# Patient Record
Sex: Female | Born: 1955 | Race: White | Hispanic: No | Smoking: Never smoker
Health system: Southern US, Community
[De-identification: ages and names within clinical notes are randomized; demographics above are authoritative.]

## PROBLEM LIST (undated history)

## (undated) DIAGNOSIS — E7800 Pure hypercholesterolemia, unspecified: Secondary | ICD-10-CM

## (undated) DIAGNOSIS — I2510 Atherosclerotic heart disease of native coronary artery without angina pectoris: Secondary | ICD-10-CM

## (undated) DIAGNOSIS — Z803 Family history of malignant neoplasm of breast: Secondary | ICD-10-CM

## (undated) DIAGNOSIS — L309 Dermatitis, unspecified: Secondary | ICD-10-CM

## (undated) DIAGNOSIS — J45909 Unspecified asthma, uncomplicated: Secondary | ICD-10-CM

## (undated) DIAGNOSIS — I10 Essential (primary) hypertension: Secondary | ICD-10-CM

## (undated) DIAGNOSIS — Z1501 Genetic susceptibility to malignant neoplasm of breast: Secondary | ICD-10-CM

## (undated) DIAGNOSIS — Z8041 Family history of malignant neoplasm of ovary: Secondary | ICD-10-CM

## (undated) DIAGNOSIS — J40 Bronchitis, not specified as acute or chronic: Secondary | ICD-10-CM

## (undated) DIAGNOSIS — Z1509 Genetic susceptibility to other malignant neoplasm: Principal | ICD-10-CM

## (undated) DIAGNOSIS — E119 Type 2 diabetes mellitus without complications: Secondary | ICD-10-CM

## (undated) DIAGNOSIS — J189 Pneumonia, unspecified organism: Secondary | ICD-10-CM

## (undated) HISTORY — DX: Atherosclerotic heart disease of native coronary artery without angina pectoris: I25.10

## (undated) HISTORY — DX: Family history of malignant neoplasm of breast: Z80.3

## (undated) HISTORY — PX: COLONOSCOPY: SHX174

## (undated) HISTORY — DX: Genetic susceptibility to malignant neoplasm of breast: Z15.01

## (undated) HISTORY — DX: Genetic susceptibility to other malignant neoplasm: Z15.09

## (undated) HISTORY — DX: Family history of malignant neoplasm of ovary: Z80.41

## (undated) HISTORY — PX: BREAST BIOPSY: SHX20

---

## 2010-02-27 ENCOUNTER — Encounter: Admission: RE | Admit: 2010-02-27 | Discharge: 2010-02-27 | Payer: Self-pay | Admitting: Internal Medicine

## 2010-05-27 ENCOUNTER — Encounter: Payer: Self-pay | Admitting: Internal Medicine

## 2013-05-07 HISTORY — PX: CORONARY ANGIOPLASTY: SHX604

## 2013-11-10 DIAGNOSIS — R079 Chest pain, unspecified: Secondary | ICD-10-CM | POA: Insufficient documentation

## 2013-11-10 DIAGNOSIS — R9439 Abnormal result of other cardiovascular function study: Secondary | ICD-10-CM | POA: Insufficient documentation

## 2013-11-10 DIAGNOSIS — E785 Hyperlipidemia, unspecified: Secondary | ICD-10-CM | POA: Insufficient documentation

## 2013-11-10 DIAGNOSIS — E119 Type 2 diabetes mellitus without complications: Secondary | ICD-10-CM | POA: Insufficient documentation

## 2013-11-10 DIAGNOSIS — I10 Essential (primary) hypertension: Secondary | ICD-10-CM | POA: Insufficient documentation

## 2013-11-26 DIAGNOSIS — Z955 Presence of coronary angioplasty implant and graft: Secondary | ICD-10-CM | POA: Insufficient documentation

## 2013-11-26 DIAGNOSIS — I2510 Atherosclerotic heart disease of native coronary artery without angina pectoris: Secondary | ICD-10-CM | POA: Insufficient documentation

## 2013-12-25 ENCOUNTER — Other Ambulatory Visit: Payer: Self-pay | Admitting: Internal Medicine

## 2013-12-25 DIAGNOSIS — R748 Abnormal levels of other serum enzymes: Secondary | ICD-10-CM

## 2013-12-25 DIAGNOSIS — R109 Unspecified abdominal pain: Secondary | ICD-10-CM

## 2014-01-07 ENCOUNTER — Encounter (HOSPITAL_COMMUNITY)
Admission: RE | Admit: 2014-01-07 | Discharge: 2014-01-07 | Disposition: A | Discharge status: Home / Self Care | Payer: 59 | Source: Ambulatory Visit | Attending: Cardiology | Admitting: Cardiology

## 2014-01-07 DIAGNOSIS — Z5189 Encounter for other specified aftercare: Secondary | ICD-10-CM | POA: Insufficient documentation

## 2014-01-07 DIAGNOSIS — Z9861 Coronary angioplasty status: Secondary | ICD-10-CM | POA: Insufficient documentation

## 2014-01-07 NOTE — Progress Notes (Signed)
Cardiac Rehab Medication Review by a Pharmacist  Does the patient  feel that his/her medications are working for him/her?  yes  Has the patient been experiencing any side effects to the medications prescribed?  Yes. Metformin does cause some diarrhea and metoprolol causes fatigue  Does the patient measure his/her own blood pressure or blood glucose at home?  Yes. CBGs usually around 130-150s.  Does the patient have any problems obtaining medications due to transportation or finances?   no  Understanding of regimen: good Understanding of indications: good Potential of compliance: good  Pharmacist comments: Pt taking medications that she reported to [place] as prescribed. No problems with medications except some minor side effects.  Sarah Ali 01/07/2014 8:44 AM

## 2014-01-13 ENCOUNTER — Ambulatory Visit (HOSPITAL_COMMUNITY): Payer: Self-pay

## 2014-01-13 ENCOUNTER — Encounter (HOSPITAL_COMMUNITY)
Admission: RE | Admit: 2014-01-13 | Discharge: 2014-01-13 | Disposition: A | Discharge status: Home / Self Care | Payer: 59 | Source: Ambulatory Visit | Attending: Cardiology | Admitting: Cardiology

## 2014-01-13 ENCOUNTER — Encounter (HOSPITAL_COMMUNITY): Payer: Self-pay

## 2014-01-13 DIAGNOSIS — Z5189 Encounter for other specified aftercare: Secondary | ICD-10-CM | POA: Diagnosis present

## 2014-01-13 DIAGNOSIS — Z9861 Coronary angioplasty status: Secondary | ICD-10-CM | POA: Diagnosis not present

## 2014-01-13 LAB — GLUCOSE, CAPILLARY
Glucose-Capillary: 132 mg/dL — ABNORMAL HIGH (ref 70–99)
Glucose-Capillary: 118 mg/dL — ABNORMAL HIGH (ref 70–99)

## 2014-01-13 NOTE — Progress Notes (Signed)
Pt started cardiac rehab today.  Pt tolerated light exercise without difficulty. VSS,telemetry-NSR. Asymptomatic.  PHQ-0.  Pt exhibits no barriers to rehab participation has positive outlook and good coping skills.  Pt does admit to work stressors with difficult work hours.pt displays normal grief pattern with death of her father this year.  Pt traveled frequently for 2 years prior to his death to visit. 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   Pt offered emotional support and reassurance.  Pt goals for rehab participation include increased knowledge of CAD risk factor modifications, ways to control CAD and weight loss.  Pt oriented to exercise equipment and routine.  Understanding verbalized.

## 2014-01-15 ENCOUNTER — Other Ambulatory Visit: Payer: Self-pay

## 2014-01-15 ENCOUNTER — Encounter (HOSPITAL_COMMUNITY): Payer: 59

## 2014-01-18 ENCOUNTER — Ambulatory Visit (HOSPITAL_COMMUNITY): Payer: Self-pay

## 2014-01-18 ENCOUNTER — Encounter (HOSPITAL_COMMUNITY)
Admission: RE | Admit: 2014-01-18 | Discharge: 2014-01-18 | Disposition: A | Discharge status: Home / Self Care | Payer: 59 | Source: Ambulatory Visit | Attending: Cardiology | Admitting: Cardiology

## 2014-01-18 DIAGNOSIS — Z5189 Encounter for other specified aftercare: Secondary | ICD-10-CM | POA: Diagnosis not present

## 2014-01-20 ENCOUNTER — Encounter (HOSPITAL_COMMUNITY)
Admission: RE | Admit: 2014-01-20 | Discharge: 2014-01-20 | Disposition: A | Discharge status: Home / Self Care | Payer: 59 | Source: Ambulatory Visit | Attending: Cardiology | Admitting: Cardiology

## 2014-01-20 DIAGNOSIS — Z5189 Encounter for other specified aftercare: Secondary | ICD-10-CM | POA: Diagnosis not present

## 2014-01-22 ENCOUNTER — Encounter (HOSPITAL_COMMUNITY): Payer: 59

## 2014-01-25 ENCOUNTER — Encounter (HOSPITAL_COMMUNITY)
Admission: RE | Admit: 2014-01-25 | Discharge: 2014-01-25 | Disposition: A | Discharge status: Home / Self Care | Payer: 59 | Source: Ambulatory Visit | Attending: Cardiology | Admitting: Cardiology

## 2014-01-25 DIAGNOSIS — Z5189 Encounter for other specified aftercare: Secondary | ICD-10-CM | POA: Diagnosis not present

## 2014-01-26 ENCOUNTER — Ambulatory Visit
Admission: RE | Admit: 2014-01-26 | Discharge: 2014-01-26 | Disposition: A | Discharge status: Home / Self Care | Payer: 59 | Source: Ambulatory Visit | Attending: Internal Medicine | Admitting: Internal Medicine

## 2014-01-26 ENCOUNTER — Encounter (INDEPENDENT_AMBULATORY_CARE_PROVIDER_SITE_OTHER): Payer: Self-pay

## 2014-01-26 DIAGNOSIS — R748 Abnormal levels of other serum enzymes: Secondary | ICD-10-CM

## 2014-01-26 DIAGNOSIS — R109 Unspecified abdominal pain: Secondary | ICD-10-CM

## 2014-01-27 ENCOUNTER — Encounter (HOSPITAL_COMMUNITY)
Admission: RE | Admit: 2014-01-27 | Discharge: 2014-01-27 | Disposition: A | Discharge status: Home / Self Care | Payer: 59 | Source: Ambulatory Visit | Attending: Cardiology | Admitting: Cardiology

## 2014-01-27 DIAGNOSIS — Z5189 Encounter for other specified aftercare: Secondary | ICD-10-CM | POA: Diagnosis not present

## 2014-01-29 ENCOUNTER — Encounter (HOSPITAL_COMMUNITY): Payer: 59

## 2014-02-01 ENCOUNTER — Encounter (HOSPITAL_COMMUNITY)
Admission: RE | Admit: 2014-02-01 | Discharge: 2014-02-01 | Disposition: A | Discharge status: Home / Self Care | Payer: 59 | Source: Ambulatory Visit | Attending: Cardiology | Admitting: Cardiology

## 2014-02-01 DIAGNOSIS — Z5189 Encounter for other specified aftercare: Secondary | ICD-10-CM | POA: Diagnosis not present

## 2014-02-03 ENCOUNTER — Encounter (HOSPITAL_COMMUNITY)
Admission: RE | Admit: 2014-02-03 | Discharge: 2014-02-03 | Disposition: A | Discharge status: Home / Self Care | Payer: 59 | Source: Ambulatory Visit | Attending: Cardiology | Admitting: Cardiology

## 2014-02-03 DIAGNOSIS — Z5189 Encounter for other specified aftercare: Secondary | ICD-10-CM | POA: Diagnosis not present

## 2014-02-03 NOTE — Progress Notes (Signed)
I have reviewed home exercise with Sarah Ali. The patient was advised to walk 2-4 days per week outside of CRP II for 30 minutes continuously.  Pt will also complete one additional day of hand weights outside of CRP II.  Progression of exercise prescription was discussed.  Reviewed THR, pulse, RPE, sign and symptoms, NTG use and when to call 911 or MD.  Pt voiced understanding. 0815  Archie Endo, MS, ACSM RCEP 02/03/2014 9:55 AM

## 2014-02-05 ENCOUNTER — Encounter (HOSPITAL_COMMUNITY): Payer: 59

## 2014-02-08 ENCOUNTER — Encounter (HOSPITAL_COMMUNITY)
Admission: RE | Admit: 2014-02-08 | Discharge: 2014-02-08 | Disposition: A | Discharge status: Home / Self Care | Payer: 59 | Source: Ambulatory Visit | Attending: Cardiology | Admitting: Cardiology

## 2014-02-08 DIAGNOSIS — Z955 Presence of coronary angioplasty implant and graft: Secondary | ICD-10-CM | POA: Insufficient documentation

## 2014-02-10 ENCOUNTER — Encounter (HOSPITAL_COMMUNITY)
Admission: RE | Admit: 2014-02-10 | Discharge: 2014-02-10 | Disposition: A | Discharge status: Home / Self Care | Payer: 59 | Source: Ambulatory Visit | Attending: Cardiology | Admitting: Cardiology

## 2014-02-10 DIAGNOSIS — Z955 Presence of coronary angioplasty implant and graft: Secondary | ICD-10-CM | POA: Diagnosis not present

## 2014-02-10 LAB — GLUCOSE, CAPILLARY: Glucose-Capillary: 206 mg/dL — ABNORMAL HIGH (ref 70–99)

## 2014-02-12 ENCOUNTER — Encounter (HOSPITAL_COMMUNITY): Payer: 59

## 2014-02-15 ENCOUNTER — Encounter (HOSPITAL_COMMUNITY)
Admission: RE | Admit: 2014-02-15 | Discharge: 2014-02-15 | Disposition: A | Discharge status: Home / Self Care | Payer: 59 | Source: Ambulatory Visit | Attending: Cardiology | Admitting: Cardiology

## 2014-02-15 DIAGNOSIS — Z955 Presence of coronary angioplasty implant and graft: Secondary | ICD-10-CM | POA: Diagnosis not present

## 2014-02-15 NOTE — Progress Notes (Signed)
(  late entry for 01/27/2014)  Quality of life reviewed with patient.  Pt is concerned about her overall health and wellbeing due to her recent illness.  Pt also has concerns about her work pattern, work hours are late and not conducive to family time.  She is hopeful to change her work hours in the coming months to allow her to spend more time with her teen daughter.  Pt has had stressful year with family obligations and death of her father.  However, pt displays positive outlook and good coping skills.  Pt offered emotional support and reassurance.  Pt offered appt with Jeanella Craze, hospital chaplain to discuss issues.  She would like to think about it and  she will let me know if she is interested in scheduling.

## 2014-02-17 ENCOUNTER — Encounter (HOSPITAL_COMMUNITY)
Admission: RE | Admit: 2014-02-17 | Discharge: 2014-02-17 | Disposition: A | Discharge status: Home / Self Care | Payer: 59 | Source: Ambulatory Visit | Attending: Cardiology | Admitting: Cardiology

## 2014-02-17 DIAGNOSIS — Z955 Presence of coronary angioplasty implant and graft: Secondary | ICD-10-CM | POA: Diagnosis not present

## 2014-02-17 NOTE — Progress Notes (Signed)
Sarah Ali 58 y.o. female Nutrition Note Spoke with pt.  Nutrition Plan and Nutrition Survey goals reviewed with pt. Pt is following Step 2 of the Therapeutic Lifestyle Changes diet. Pt wants to lose wt. Per pt, "I've lost 10-12 lbs since June, but I'd like to lose more." Wt loss tips briefly reviewed. Barriers to wt loss include pt's work schedule and lack of exercise on non-rehab days. Pt is diabetic. Last A1c indicates blood glucose well-controlled. Pt reports her A1c was rechecked last week and "it was 5.5." Per pt, fasting CBG's typically run 140-155 mg/dL. This Probation officer went over Diabetes Education test results. Pt expressed understanding of the information reviewed. Pt aware of nutrition education classes offered and plans on attending nutrition classes.  Nutrition Diagnosis   Food-and nutrition-related knowledge deficit related to lack of exposure to information as related to diagnosis of: ? CVD ? DM (A1c 6.7)   Overweight related to excessive energy intake as evidenced by a BMI of 29.0  Nutrition RX/ Estimated Daily Nutrition Needs for: wt loss  1200-1700 Kcal, 30-45 gm fat, 7-13 gm sat fat, 1.1-1.7 gm trans-fat, <1500 mg sodium, 150-175 gm CHO   Nutrition Intervention   Pt's individual nutrition plan reviewed with pt.   Benefits of adopting Therapeutic Lifestyle Changes discussed when Medficts reviewed.   Pt to attend the Portion Distortion class - met; 02/03/14   Pt to attend the  ? Nutrition I class                     ? Nutrition II class        ? Diabetes Blitz class - met; 01/19/14       ? Diabetes Q & A class   Continue client-centered nutrition education by RD, as part of interdisciplinary care. Goal(s)   Pt to identify food quantities necessary to achieve: ? wt loss to a goal wt of 156-174 lb (71.2-79.4 kg) at graduation from cardiac rehab.    CBG concentrations in the normal range or as close to normal as is safely possible. Monitor and Evaluate progress toward nutrition goal  with team. Nutrition Risk: Change to Moderate Derek Mound, M.Ed, RD, LDN, CDE 02/17/2014 9:32 AM

## 2014-02-19 ENCOUNTER — Encounter (HOSPITAL_COMMUNITY): Payer: 59

## 2014-02-22 ENCOUNTER — Encounter (HOSPITAL_COMMUNITY)
Admission: RE | Admit: 2014-02-22 | Discharge: 2014-02-22 | Disposition: A | Discharge status: Home / Self Care | Payer: 59 | Source: Ambulatory Visit | Attending: Cardiology | Admitting: Cardiology

## 2014-02-22 DIAGNOSIS — Z955 Presence of coronary angioplasty implant and graft: Secondary | ICD-10-CM | POA: Diagnosis not present

## 2014-02-24 ENCOUNTER — Encounter (HOSPITAL_COMMUNITY)
Admission: RE | Admit: 2014-02-24 | Discharge: 2014-02-24 | Disposition: A | Discharge status: Home / Self Care | Payer: 59 | Source: Ambulatory Visit | Attending: Cardiology | Admitting: Cardiology

## 2014-02-24 DIAGNOSIS — Z955 Presence of coronary angioplasty implant and graft: Secondary | ICD-10-CM | POA: Diagnosis not present

## 2014-02-26 ENCOUNTER — Encounter (HOSPITAL_COMMUNITY): Payer: 59

## 2014-03-01 ENCOUNTER — Encounter (HOSPITAL_COMMUNITY)
Admission: RE | Admit: 2014-03-01 | Discharge: 2014-03-01 | Disposition: A | Discharge status: Home / Self Care | Payer: 59 | Source: Ambulatory Visit | Attending: Cardiology | Admitting: Cardiology

## 2014-03-01 DIAGNOSIS — Z955 Presence of coronary angioplasty implant and graft: Secondary | ICD-10-CM | POA: Diagnosis not present

## 2014-03-03 ENCOUNTER — Encounter (HOSPITAL_COMMUNITY)
Admission: RE | Admit: 2014-03-03 | Discharge: 2014-03-03 | Disposition: A | Discharge status: Home / Self Care | Payer: 59 | Source: Ambulatory Visit | Attending: Cardiology | Admitting: Cardiology

## 2014-03-03 DIAGNOSIS — Z955 Presence of coronary angioplasty implant and graft: Secondary | ICD-10-CM | POA: Diagnosis not present

## 2014-03-05 ENCOUNTER — Encounter (HOSPITAL_COMMUNITY): Payer: 59

## 2014-03-08 ENCOUNTER — Encounter (HOSPITAL_COMMUNITY)
Admission: RE | Admit: 2014-03-08 | Discharge: 2014-03-08 | Disposition: A | Discharge status: Home / Self Care | Payer: 59 | Source: Ambulatory Visit | Attending: Cardiology | Admitting: Cardiology

## 2014-03-08 DIAGNOSIS — Z955 Presence of coronary angioplasty implant and graft: Secondary | ICD-10-CM | POA: Insufficient documentation

## 2014-03-10 ENCOUNTER — Encounter (HOSPITAL_COMMUNITY): Payer: 59

## 2014-03-10 DIAGNOSIS — Z955 Presence of coronary angioplasty implant and graft: Secondary | ICD-10-CM | POA: Diagnosis not present

## 2014-03-12 ENCOUNTER — Encounter (HOSPITAL_COMMUNITY): Payer: 59

## 2014-03-15 ENCOUNTER — Encounter (HOSPITAL_COMMUNITY)
Admission: RE | Admit: 2014-03-15 | Discharge: 2014-03-15 | Disposition: A | Discharge status: Home / Self Care | Payer: 59 | Source: Ambulatory Visit | Attending: Cardiology | Admitting: Cardiology

## 2014-03-15 DIAGNOSIS — Z955 Presence of coronary angioplasty implant and graft: Secondary | ICD-10-CM | POA: Diagnosis not present

## 2014-03-17 ENCOUNTER — Encounter (HOSPITAL_COMMUNITY)
Admission: RE | Admit: 2014-03-17 | Discharge: 2014-03-17 | Disposition: A | Discharge status: Home / Self Care | Payer: 59 | Source: Ambulatory Visit | Attending: Cardiology | Admitting: Cardiology

## 2014-03-17 DIAGNOSIS — Z955 Presence of coronary angioplasty implant and graft: Secondary | ICD-10-CM | POA: Diagnosis not present

## 2014-03-19 ENCOUNTER — Encounter (HOSPITAL_COMMUNITY): Payer: 59

## 2014-03-22 ENCOUNTER — Telehealth (HOSPITAL_COMMUNITY): Payer: Self-pay | Admitting: Internal Medicine

## 2014-03-22 ENCOUNTER — Encounter (HOSPITAL_COMMUNITY): Payer: 59

## 2014-03-24 ENCOUNTER — Encounter (HOSPITAL_COMMUNITY)
Admission: RE | Admit: 2014-03-24 | Discharge: 2014-03-24 | Disposition: A | Discharge status: Home / Self Care | Payer: 59 | Source: Ambulatory Visit | Attending: Cardiology | Admitting: Cardiology

## 2014-03-24 DIAGNOSIS — Z955 Presence of coronary angioplasty implant and graft: Secondary | ICD-10-CM | POA: Diagnosis not present

## 2014-03-26 ENCOUNTER — Encounter (HOSPITAL_COMMUNITY): Payer: 59

## 2014-03-29 ENCOUNTER — Encounter (HOSPITAL_COMMUNITY)
Admission: RE | Admit: 2014-03-29 | Discharge: 2014-03-29 | Disposition: A | Discharge status: Home / Self Care | Payer: 59 | Source: Ambulatory Visit | Attending: Cardiology | Admitting: Cardiology

## 2014-03-29 DIAGNOSIS — Z955 Presence of coronary angioplasty implant and graft: Secondary | ICD-10-CM | POA: Diagnosis not present

## 2014-03-31 ENCOUNTER — Encounter (HOSPITAL_COMMUNITY)
Admission: RE | Admit: 2014-03-31 | Discharge: 2014-03-31 | Disposition: A | Discharge status: Home / Self Care | Payer: 59 | Source: Ambulatory Visit | Attending: Cardiology | Admitting: Cardiology

## 2014-03-31 DIAGNOSIS — Z955 Presence of coronary angioplasty implant and graft: Secondary | ICD-10-CM | POA: Diagnosis not present

## 2014-04-05 ENCOUNTER — Encounter (HOSPITAL_COMMUNITY)
Admission: RE | Admit: 2014-04-05 | Discharge: 2014-04-05 | Disposition: A | Discharge status: Home / Self Care | Payer: 59 | Source: Ambulatory Visit | Attending: Cardiology | Admitting: Cardiology

## 2014-04-05 DIAGNOSIS — Z955 Presence of coronary angioplasty implant and graft: Secondary | ICD-10-CM | POA: Diagnosis not present

## 2014-04-07 ENCOUNTER — Encounter (HOSPITAL_COMMUNITY)
Admission: RE | Admit: 2014-04-07 | Discharge: 2014-04-07 | Disposition: A | Discharge status: Home / Self Care | Payer: 59 | Source: Ambulatory Visit | Attending: Cardiology | Admitting: Cardiology

## 2014-04-07 DIAGNOSIS — Z955 Presence of coronary angioplasty implant and graft: Secondary | ICD-10-CM | POA: Insufficient documentation

## 2014-04-09 ENCOUNTER — Encounter (HOSPITAL_COMMUNITY): Payer: 59

## 2014-04-12 ENCOUNTER — Encounter (HOSPITAL_COMMUNITY)
Admission: RE | Admit: 2014-04-12 | Discharge: 2014-04-12 | Disposition: A | Discharge status: Home / Self Care | Payer: 59 | Source: Ambulatory Visit | Attending: Cardiology | Admitting: Cardiology

## 2014-04-12 DIAGNOSIS — Z955 Presence of coronary angioplasty implant and graft: Secondary | ICD-10-CM | POA: Diagnosis not present

## 2014-04-14 ENCOUNTER — Encounter (HOSPITAL_COMMUNITY)
Admission: RE | Admit: 2014-04-14 | Discharge: 2014-04-14 | Disposition: A | Discharge status: Home / Self Care | Payer: 59 | Source: Ambulatory Visit | Attending: Cardiology | Admitting: Cardiology

## 2014-04-14 DIAGNOSIS — Z955 Presence of coronary angioplasty implant and graft: Secondary | ICD-10-CM | POA: Diagnosis not present

## 2014-04-16 ENCOUNTER — Encounter (HOSPITAL_COMMUNITY): Payer: 59

## 2014-04-19 ENCOUNTER — Encounter (HOSPITAL_COMMUNITY)
Admission: RE | Admit: 2014-04-19 | Discharge: 2014-04-19 | Disposition: A | Discharge status: Home / Self Care | Payer: 59 | Source: Ambulatory Visit | Attending: Cardiology | Admitting: Cardiology

## 2014-04-19 DIAGNOSIS — Z955 Presence of coronary angioplasty implant and graft: Secondary | ICD-10-CM | POA: Diagnosis not present

## 2014-04-21 ENCOUNTER — Encounter (HOSPITAL_COMMUNITY)
Admission: RE | Admit: 2014-04-21 | Discharge: 2014-04-21 | Disposition: A | Discharge status: Home / Self Care | Payer: 59 | Source: Ambulatory Visit | Attending: Cardiology | Admitting: Cardiology

## 2014-04-21 DIAGNOSIS — Z955 Presence of coronary angioplasty implant and graft: Secondary | ICD-10-CM | POA: Diagnosis not present

## 2014-04-23 ENCOUNTER — Encounter (HOSPITAL_COMMUNITY): Payer: 59

## 2014-04-26 ENCOUNTER — Encounter (HOSPITAL_COMMUNITY)
Admission: RE | Admit: 2014-04-26 | Discharge: 2014-04-26 | Disposition: A | Discharge status: Home / Self Care | Payer: 59 | Source: Ambulatory Visit | Attending: Cardiology | Admitting: Cardiology

## 2014-04-26 ENCOUNTER — Encounter (HOSPITAL_COMMUNITY): Payer: Self-pay

## 2014-04-26 DIAGNOSIS — Z955 Presence of coronary angioplasty implant and graft: Secondary | ICD-10-CM | POA: Diagnosis not present

## 2014-04-28 ENCOUNTER — Encounter (HOSPITAL_COMMUNITY)
Admission: RE | Admit: 2014-04-28 | Discharge: 2014-04-28 | Disposition: A | Discharge status: Home / Self Care | Payer: 59 | Source: Ambulatory Visit | Attending: Cardiology | Admitting: Cardiology

## 2014-04-28 DIAGNOSIS — Z955 Presence of coronary angioplasty implant and graft: Secondary | ICD-10-CM | POA: Diagnosis not present

## 2014-04-28 NOTE — Progress Notes (Signed)
Pt graduated from cardiac rehab program today with completion of  exercise sessions in Phase II. Pt maintained good attendance and progressed nicely during her participation in rehab as evidenced by increased MET level.    Medication list reconciled. Repeat  PHQ score-0.  Pt QOL scores increased with pt verbalizing increased stress and time management techniques at home and work.   Pt has made significant lifestyle changes and should be commended for her success. Pt feels she has achieved his goals during cardiac rehab especially with increased energy and discipline.  Pt has a fitbit which offers motivation and reminders to exercise.   Pt plans to continue exercising on her own using home treadmill.

## 2014-05-03 ENCOUNTER — Encounter (HOSPITAL_COMMUNITY)
Admission: RE | Admit: 2014-05-03 | Discharge: 2014-05-03 | Disposition: A | Discharge status: Home / Self Care | Payer: 59 | Source: Ambulatory Visit | Attending: Cardiology | Admitting: Cardiology

## 2014-05-03 DIAGNOSIS — Z955 Presence of coronary angioplasty implant and graft: Secondary | ICD-10-CM | POA: Diagnosis not present

## 2014-05-05 ENCOUNTER — Encounter (HOSPITAL_COMMUNITY)
Admission: RE | Admit: 2014-05-05 | Discharge: 2014-05-05 | Disposition: A | Discharge status: Home / Self Care | Payer: 59 | Source: Ambulatory Visit | Attending: Cardiology | Admitting: Cardiology

## 2014-05-05 DIAGNOSIS — Z955 Presence of coronary angioplasty implant and graft: Secondary | ICD-10-CM | POA: Diagnosis not present

## 2014-05-10 ENCOUNTER — Encounter (HOSPITAL_COMMUNITY): Admission: RE | Admit: 2014-05-10 | Payer: 59 | Source: Ambulatory Visit

## 2014-05-12 ENCOUNTER — Encounter (HOSPITAL_COMMUNITY): Payer: 59

## 2014-05-14 ENCOUNTER — Encounter (HOSPITAL_COMMUNITY): Payer: 59

## 2016-04-20 ENCOUNTER — Telehealth: Payer: Self-pay | Admitting: Genetic Counselor

## 2016-04-20 NOTE — Telephone Encounter (Signed)
I left a message with the Genetic Counseling code of [id number] and two testing codes [phone] and [phone].  I let her know hat we will put her on a wait list and call if we have a last minute cancellation.

## 2016-04-26 ENCOUNTER — Other Ambulatory Visit: Payer: Self-pay | Admitting: Obstetrics and Gynecology

## 2016-04-26 DIAGNOSIS — Z1501 Genetic susceptibility to malignant neoplasm of breast: Secondary | ICD-10-CM

## 2016-04-26 DIAGNOSIS — Z1509 Genetic susceptibility to other malignant neoplasm: Principal | ICD-10-CM

## 2016-04-27 ENCOUNTER — Ambulatory Visit
Admission: RE | Admit: 2016-04-27 | Discharge: 2016-04-27 | Disposition: A | Discharge status: Home / Self Care | Payer: 59 | Source: Ambulatory Visit | Attending: Obstetrics and Gynecology | Admitting: Obstetrics and Gynecology

## 2016-04-27 DIAGNOSIS — Z1509 Genetic susceptibility to other malignant neoplasm: Principal | ICD-10-CM

## 2016-04-27 DIAGNOSIS — Z1501 Genetic susceptibility to malignant neoplasm of breast: Secondary | ICD-10-CM

## 2016-04-27 MED ORDER — GADOBENATE DIMEGLUMINE 529 MG/ML IV SOLN
17.0000 mL | Freq: Once | INTRAVENOUS | Status: AC | PRN
  Administered 2016-04-27: 17 mL via INTRAVENOUS

## 2016-05-02 ENCOUNTER — Encounter: Payer: Self-pay | Admitting: Genetic Counselor

## 2016-05-02 ENCOUNTER — Ambulatory Visit (HOSPITAL_BASED_OUTPATIENT_CLINIC_OR_DEPARTMENT_OTHER): Payer: 59 | Admitting: Genetic Counselor

## 2016-05-02 DIAGNOSIS — Z803 Family history of malignant neoplasm of breast: Secondary | ICD-10-CM

## 2016-05-02 DIAGNOSIS — Z315 Encounter for genetic counseling: Secondary | ICD-10-CM

## 2016-05-02 DIAGNOSIS — Z1502 Genetic susceptibility to malignant neoplasm of ovary: Secondary | ICD-10-CM

## 2016-05-02 DIAGNOSIS — Z1509 Genetic susceptibility to other malignant neoplasm: Principal | ICD-10-CM

## 2016-05-02 DIAGNOSIS — Z1501 Genetic susceptibility to malignant neoplasm of breast: Secondary | ICD-10-CM | POA: Diagnosis not present

## 2016-05-02 DIAGNOSIS — Z8041 Family history of malignant neoplasm of ovary: Secondary | ICD-10-CM | POA: Insufficient documentation

## 2016-05-02 NOTE — Progress Notes (Signed)
GENETIC TEST RESULTS   Patient Name: Sarah Ali Patient Age: 60 y.o. Encounter Date: 05/02/2016  Referring Provider: Servando Salina, MD    Sarah Ali was seen in the [facility] clinic on May 02, 2016 due to a family history of cancer, a known BRCA1 mutation and concern regarding a hereditary predisposition to cancer in the family.   FAMILY HISTORY:  We obtained a detailed, 4-generation family history.  Significant diagnoses are listed below: Family History  Problem Relation Age of Onset  . Colon cancer Mother 1  . Ovarian cancer Mother 57  . Skin cancer Father   . BRCA 1/2 Brother     BRCA1 negative  . Breast cancer Maternal Aunt     dx in her 18s and again at 20  . BRCA 1/2 Maternal Aunt     BRCA1 positive  . Colon cancer Paternal Aunt     dx in her 85s  . Breast cancer Maternal Grandmother   . Parkinson's disease Maternal Grandfather 12    Ms. Mcquiston has three children, two sons and a transgender daughter who is transitioning to female who are all cancer free.  She has on sister and one brother who are cancer free.  Her brother had undergone genetic testing and is BRCA negative.  The patient's father had skin cancer and died of heart problems at 67.  He had one sister who was diagnosed with colon cancer in her 47's.  His parents are both deceased and died of non cancer related issues in their 30's and 12's.    The patient's mother was diagnosed with colon cancer, which was thought to maybe have started in her ovaries, at age 70.  Her mother has a sister who has had bilateral breast cancer and was found to have a BRCA1 mutation.  The patient's grandmother had breast cancer in her 19's-70's and her grandfather was diagnosed with Parkinson's disease at 4 and died at 60.  Patient's maternal ancestors are of [place] descent, and paternal ancestors are of [place] descent. There is reported Ashkenazi Jewish ancestry. There is no known consanguinity.  GENETIC  TESTING:  Sarah Ali underwent genetic testing at her OB/GYN office due to the known family mutation in [place].  Dr. Harvie Bridge ordered the [facility] 29 gene panel.  The genetic testing reported on March 30, 2016 through the [facility] Panel offered by [facility] identified a single, heterozygous pathogenic gene mutation called BRCA1, c.5266dupC. There were no deleterious mutations in  APC, ATM, BMPR1A, BRCA1, BRCA2, BRIP1, CDH1, CDK4, CDKN2A, CHEK2, EPCAM, GREM1, MLH1, MSH2, MSH6, MUTYH, NBN, PALB2, PMS2, POLD1, POLE, PTEN, RAD51C, RAD51D, RET, SMAD4, STK11, TP53 and VHL.  Variants of uncertain significance (VUS) have been excluded from this report.  VUS are changes that cannot be classified due to lack of information on that particular change.  While professional societies recommend that VUS's not be used in the clinical management decisions, the reporting of VUS's is encouraged for patients who have a personal history of cancer, those with a strong family history of cancer, or those who have a known hereditary cancer syndrome (and causal variant) in their family.  This was discussed with Sarah Ali. She was encouraged to talk to Dr. Harvie Bridge about contacting [facility] and getting her report reissued with VUS's reported, if she was interested in getting information on any potential VUS's that may be present.   MEDICAL MANAGEMENT: Women who have a BRCA mutation have an increased risk for both breast and ovarian cancer.  As discussed with Sarah Ali, to reduce the risk for breast cancer, prophylactic bilateral mastectomy is the most effective option. However, for women who choose to keep their breasts, we recommend yearly mammograms, yearly breast MRI, twice-yearly clinical breast exams through a high-risk clinic, and monthly self-breast exams.   To reduce the risk for ovarian cancer, we recommend Sarah Ali  have a prophylactic bilateral salpingo-oophorectomy when childbearing is completed, if  planned. We discussed that screening with CA-125 blood tests and transvaginal ultrasounds can be done twice per year. However, these tests have not been shown to detect ovarian cancer at an early stage.  RISK REDUCTION: There are several things that can be offered to individuals who are carriers for BRCA mutations that will reduce the risk for getting cancer.   The use of oral contraceptives can lower the risk for ovarian cancer, and, per case control studies, does not significantly increase the risk for breast cancer in BRCA patients. Case control studies have shown that oral contraceptives can lower the risk for ovarian cancer in women with BRCA mutations. Additionally, a more recent meta-analysis, including one cohort (n=3,181) and one case control study (1,096 cases and 2,878 controls) also showed an inverse correlation between ovarian cancer and ever having used oral contraceptives (OR, 0.58; 95% CI = 0.46-0.73). Studies on oral contraceptives and breast cancer have been conflicting, with some studies suggesting that there is not an increased risk for breast cancer in BRCA mutation carriers, while others suggest that there could be a risk. That said, two meta-analysis studies have shown that there is not an increased risk for breast cancer with oral contraceptive use in BRCA1 and BRCA2 carriers.   In individuals who have a prophylactic bilateral salpingo-oophorectomy (BSO), the risk for breast cancer is reduced by up to 50%. It has been reported that short term hormone replacement therapy in women undergoing prophylactic BSO does not negate the reduction of breast cancer risk associated with surgery (1.2018 NCCN guidelines).  FAMILY MEMBERS: It is important that all of Sarah Ali's relatives (both men and women) know of the presence of this gene mutation. Site-specific genetic testing can sort out who in the family is at risk and who is not.   Sarah Ali's children and siblings have a 50%  chance to have inherited this mutation. We recommend they have genetic testing for this same mutation, as identifying the presence of this mutation would allow them to also take advantage of risk-reducing measures.   We discussed the specific issue about Ms. Withrow's daughter, who is transgender and is in the process of transitioning from female to female. As with her other children, it will be important to understand if he is BRCA1 positive or not. However, if he is BRCA1 positive, there may be specific issues surrounding his transition that may (or may not) be important to address.  Transgender is an emerging issue in the BRCA community, and most information is on the transition from female to female and exposure to estrogen in this process.  We discussed that, should he be BRCA1 positive, Ms. Copes's son will need to perform his own self breast exams, however, at some point he is interested in undergoing a mastectomy.  He will also need to consider removal of his fallopian tubes and ovaries at some point.  We discussed whether it would be important to undergo genetic testing now at age 3.  Considering that he is in active transition from female to female, and that he is 32, there  may be other issues that may be more pressing at this current time.  We would not recommend undergoing breast cancer screening until around age 17, so allowing him to complete his transition and think about testing later is one strategy to consider.  We discussed that typically we wait to test women who are NOT transitioning until closer to age 94 because of all the changes young people are going through (school, moving out on their own, starting jobs, etc...), that dealing with a BRCA1 positive result may be easier once many changes are already out of the way.  SUPPORT AND RESOURCES: If Ms. Hostler is interested in BRCA-specific information and support, there are two groups, Facing Our Risk (www.facingourrisk.com) and Bright Pink  (www.brightpink.org) which some people have found useful. Based on her Ashkenazi [place] heritage, we also provided information about Engineer, maintenance (IT) (www.sharsheret.com).  They provide opportunities to speak with other individuals from high-risk families. To locate genetic counselors in other cities, visit the website of the [employer] (ArtistMovie.se) and Secretary/administrator for a Social worker by zip code.  Lastly, I provided information about the [place] out of [facility].  This group will link up families with researchers, if interested, as well as provide a twice yearly newsletter on updates about genetic syndromes.  We encouraged Ms. Harkey to remain in contact with [place] on an annual basis so we can update her personal and family histories, and let her know of advances in cancer genetics that may benefit the family. Our contact number was provided. Ms. Koke's questions were answered to her satisfaction today, and she knows she is welcome to call anytime with additional questions.   Sobia Karger P. Florene Glen, [place], [facility] Certified Genetic Counselor [email or web address] phone: [phone]

## 2016-05-30 ENCOUNTER — Other Ambulatory Visit: Payer: Self-pay | Admitting: Obstetrics and Gynecology

## 2016-05-30 DIAGNOSIS — N63 Unspecified lump in unspecified breast: Secondary | ICD-10-CM

## 2016-06-04 ENCOUNTER — Other Ambulatory Visit: Payer: 59

## 2016-06-05 ENCOUNTER — Ambulatory Visit
Admission: RE | Admit: 2016-06-05 | Discharge: 2016-06-05 | Disposition: A | Discharge status: Home / Self Care | Payer: 59 | Source: Ambulatory Visit | Attending: Obstetrics and Gynecology | Admitting: Obstetrics and Gynecology

## 2016-06-05 DIAGNOSIS — N6323 Unspecified lump in the left breast, lower outer quadrant: Secondary | ICD-10-CM | POA: Diagnosis not present

## 2016-06-05 DIAGNOSIS — N63 Unspecified lump in unspecified breast: Secondary | ICD-10-CM

## 2016-06-05 DIAGNOSIS — N6324 Unspecified lump in the left breast, lower inner quadrant: Secondary | ICD-10-CM | POA: Diagnosis not present

## 2016-06-14 ENCOUNTER — Other Ambulatory Visit: Payer: Self-pay | Admitting: Obstetrics and Gynecology

## 2016-06-14 DIAGNOSIS — R928 Other abnormal and inconclusive findings on diagnostic imaging of breast: Secondary | ICD-10-CM

## 2016-06-29 ENCOUNTER — Ambulatory Visit
Admission: RE | Admit: 2016-06-29 | Discharge: 2016-06-29 | Disposition: A | Discharge status: Home / Self Care | Payer: 59 | Source: Ambulatory Visit | Attending: Obstetrics and Gynecology | Admitting: Obstetrics and Gynecology

## 2016-06-29 DIAGNOSIS — N6323 Unspecified lump in the left breast, lower outer quadrant: Secondary | ICD-10-CM | POA: Diagnosis not present

## 2016-06-29 DIAGNOSIS — R928 Other abnormal and inconclusive findings on diagnostic imaging of breast: Secondary | ICD-10-CM

## 2016-06-29 DIAGNOSIS — N6012 Diffuse cystic mastopathy of left breast: Secondary | ICD-10-CM | POA: Diagnosis not present

## 2016-06-29 MED ORDER — GADOBENATE DIMEGLUMINE 529 MG/ML IV SOLN
17.0000 mL | Freq: Once | INTRAVENOUS | Status: AC | PRN
  Administered 2016-06-29: 17 mL via INTRAVENOUS

## 2016-07-04 ENCOUNTER — Other Ambulatory Visit: Payer: Self-pay | Admitting: Obstetrics and Gynecology

## 2016-07-05 HISTORY — PX: BREAST SURGERY: SHX581

## 2016-07-06 NOTE — Patient Instructions (Signed)
Your procedure is scheduled on:  Thursday, July 19, 2016  Enter through the Main Entrance of [facility] at:  6:00 AM  Pick up the phone at the desk and dial [phone].  Call this number if you have problems the morning of surgery: [phone].  Remember: Do NOT eat food or drink after:  Midnight Wednesday, July 18, 2016  Take these medicines the morning of surgery with a SIP OF WATER:  Amlodipine  Do NOT take evening dose of Metformin the day before surgery  Stop ALL herbal medications and Vitamin E at this time  Do NOT smoke the day of surgery.  Do NOT wear jewelry (body piercing), metal hair clips/bobby pins, make-up, or nail polish. Do NOT wear lotions, powders, or perfumes.  You may wear deodorant. Do NOT shave for 48 hours prior to surgery. Do NOT bring valuables to the hospital. Contacts, dentures, or bridgework may not be worn into surgery.  Have a responsible adult drive you home and stay with you for 24 hours after your procedure  Bring a copy of your healthcare power of attorney and living will documents.  **Effective Friday, Jan. 12, 2018, [place] will implement no hospital visitations from children age 57 and younger due to a steady increase in flu activity in our community and hospitals. **

## 2016-07-09 ENCOUNTER — Inpatient Hospital Stay (HOSPITAL_COMMUNITY)
Admission: RE | Admit: 2016-07-09 | Discharge: 2016-07-09 | Disposition: A | Discharge status: Home / Self Care | Payer: 59 | Source: Ambulatory Visit

## 2016-07-10 NOTE — Patient Instructions (Addendum)
Your procedure is scheduled on:  Thursday, July 19, 2016  Enter through the Main Entrance of [facility] at:  6:00 AM  Pick up the phone at the desk and dial [phone].  Call this number if you have problems the morning of surgery: [phone].  Remember: Do NOT eat food or drink after:  Midnight Wednesday  Take these medicines the morning of surgery with a SIP OF WATER:  Amlodipine  Do not take evening dose of Metformin  Stop ALL herbal medications and Vitamin E at this time  Do NOT smoke the day of surgery.  Do NOT wear jewelry (body piercing), metal hair clips/bobby pins, make-up, or nail polish. Do NOT wear lotions, powders, or perfumes.  You may wear deodorant. Do NOT shave for 48 hours prior to surgery. Do NOT bring valuables to the hospital. Contacts, dentures, or bridgework may not be worn into surgery.  Have a responsible adult drive you home and stay with you for 24 hours after your procedure  Bring a copy of your healthcare power of attorney and living will documents.  **Effective Friday, Jan. 12, 2018, [place] will implement no hospital visitations from children age 56 and younger due to a steady increase in flu activity in our community and hospitals. **

## 2016-07-11 ENCOUNTER — Other Ambulatory Visit: Payer: Self-pay

## 2016-07-11 ENCOUNTER — Encounter (HOSPITAL_COMMUNITY): Payer: Self-pay

## 2016-07-11 ENCOUNTER — Encounter (HOSPITAL_COMMUNITY)
Admission: RE | Admit: 2016-07-11 | Discharge: 2016-07-11 | Disposition: A | Discharge status: Home / Self Care | Payer: 59 | Source: Ambulatory Visit | Attending: Obstetrics and Gynecology | Admitting: Obstetrics and Gynecology

## 2016-07-11 DIAGNOSIS — E7800 Pure hypercholesterolemia, unspecified: Secondary | ICD-10-CM | POA: Diagnosis not present

## 2016-07-11 DIAGNOSIS — I510 Cardiac septal defect, acquired: Secondary | ICD-10-CM | POA: Diagnosis not present

## 2016-07-11 DIAGNOSIS — Z01812 Encounter for preprocedural laboratory examination: Secondary | ICD-10-CM | POA: Insufficient documentation

## 2016-07-11 DIAGNOSIS — Z803 Family history of malignant neoplasm of breast: Secondary | ICD-10-CM | POA: Diagnosis not present

## 2016-07-11 DIAGNOSIS — Z8041 Family history of malignant neoplasm of ovary: Secondary | ICD-10-CM | POA: Insufficient documentation

## 2016-07-11 DIAGNOSIS — I10 Essential (primary) hypertension: Secondary | ICD-10-CM | POA: Diagnosis not present

## 2016-07-11 DIAGNOSIS — Z1502 Genetic susceptibility to malignant neoplasm of ovary: Secondary | ICD-10-CM | POA: Diagnosis not present

## 2016-07-11 DIAGNOSIS — E119 Type 2 diabetes mellitus without complications: Secondary | ICD-10-CM | POA: Diagnosis not present

## 2016-07-11 DIAGNOSIS — Z01810 Encounter for preprocedural cardiovascular examination: Secondary | ICD-10-CM | POA: Insufficient documentation

## 2016-07-11 HISTORY — DX: Bronchitis, not specified as acute or chronic: J40

## 2016-07-11 HISTORY — DX: Type 2 diabetes mellitus without complications: E11.9

## 2016-07-11 HISTORY — DX: Essential (primary) hypertension: I10

## 2016-07-11 HISTORY — DX: Unspecified asthma, uncomplicated: J45.909

## 2016-07-11 HISTORY — DX: Dermatitis, unspecified: L30.9

## 2016-07-11 HISTORY — DX: Pneumonia, unspecified organism: J18.9

## 2016-07-11 HISTORY — DX: Pure hypercholesterolemia, unspecified: E78.00

## 2016-07-11 LAB — CBC
HCT: 42.1 % (ref 36.0–46.0)
HEMOGLOBIN: 14.9 g/dL (ref 12.0–15.0)
MCH: 30.8 pg (ref 26.0–34.0)
MCHC: 35.4 g/dL (ref 30.0–36.0)
MCV: 87 fL (ref 78.0–100.0)
PLATELETS: 175 10*3/uL (ref 150–400)
RBC: 4.84 MIL/uL (ref 3.87–5.11)
RDW: 13.2 % (ref 11.5–15.5)
WBC: 5 10*3/uL (ref 4.0–10.5)

## 2016-07-11 LAB — BASIC METABOLIC PANEL
ANION GAP: 11 (ref 5–15)
BUN: 20 mg/dL (ref 6–20)
CHLORIDE: 102 mmol/L (ref 101–111)
CO2: 26 mmol/L (ref 22–32)
Calcium: 9.3 mg/dL (ref 8.9–10.3)
Creatinine, Ser: 0.92 mg/dL (ref 0.44–1.00)
GFR calc Af Amer: >60 mL/min (ref >60)
Glucose, Bld: 209 mg/dL — ABNORMAL HIGH (ref 65–99)
POTASSIUM: 4.1 mmol/L (ref 3.5–5.1)
Sodium: 139 mmol/L (ref 135–145)

## 2016-07-12 NOTE — Pre-Procedure Instructions (Addendum)
Dr. Lyndle Herrlich reviewed EKG's and Ms Deis's history, made aware of glucose 209 (not fasting)  no new orders received at this time.

## 2016-07-19 ENCOUNTER — Ambulatory Visit (HOSPITAL_COMMUNITY)
Admission: RE | Admit: 2016-07-19 | Discharge: 2016-07-19 | Disposition: A | Discharge status: Home / Self Care | Payer: 59 | Source: Ambulatory Visit | Attending: Obstetrics and Gynecology | Admitting: Obstetrics and Gynecology

## 2016-07-19 ENCOUNTER — Ambulatory Visit (HOSPITAL_COMMUNITY): Payer: 59 | Admitting: Anesthesiology

## 2016-07-19 ENCOUNTER — Encounter (HOSPITAL_COMMUNITY)
Admission: RE | Disposition: A | Discharge status: Home / Self Care | Payer: Self-pay | Source: Ambulatory Visit | Attending: Obstetrics and Gynecology

## 2016-07-19 ENCOUNTER — Encounter (HOSPITAL_COMMUNITY): Payer: Self-pay | Admitting: *Deleted

## 2016-07-19 DIAGNOSIS — Z955 Presence of coronary angioplasty implant and graft: Secondary | ICD-10-CM | POA: Insufficient documentation

## 2016-07-19 DIAGNOSIS — Z8249 Family history of ischemic heart disease and other diseases of the circulatory system: Secondary | ICD-10-CM | POA: Insufficient documentation

## 2016-07-19 DIAGNOSIS — Z7984 Long term (current) use of oral hypoglycemic drugs: Secondary | ICD-10-CM | POA: Diagnosis not present

## 2016-07-19 DIAGNOSIS — Z1501 Genetic susceptibility to malignant neoplasm of breast: Secondary | ICD-10-CM | POA: Diagnosis not present

## 2016-07-19 DIAGNOSIS — Z833 Family history of diabetes mellitus: Secondary | ICD-10-CM | POA: Insufficient documentation

## 2016-07-19 DIAGNOSIS — E119 Type 2 diabetes mellitus without complications: Secondary | ICD-10-CM | POA: Diagnosis not present

## 2016-07-19 DIAGNOSIS — I10 Essential (primary) hypertension: Secondary | ICD-10-CM | POA: Diagnosis not present

## 2016-07-19 DIAGNOSIS — Z8041 Family history of malignant neoplasm of ovary: Secondary | ICD-10-CM | POA: Diagnosis not present

## 2016-07-19 DIAGNOSIS — N840 Polyp of corpus uteri: Secondary | ICD-10-CM | POA: Insufficient documentation

## 2016-07-19 DIAGNOSIS — K660 Peritoneal adhesions (postprocedural) (postinfection): Secondary | ICD-10-CM | POA: Insufficient documentation

## 2016-07-19 DIAGNOSIS — Z803 Family history of malignant neoplasm of breast: Secondary | ICD-10-CM | POA: Diagnosis not present

## 2016-07-19 DIAGNOSIS — I2510 Atherosclerotic heart disease of native coronary artery without angina pectoris: Secondary | ICD-10-CM | POA: Insufficient documentation

## 2016-07-19 DIAGNOSIS — N83202 Unspecified ovarian cyst, left side: Secondary | ICD-10-CM | POA: Diagnosis not present

## 2016-07-19 DIAGNOSIS — Z79899 Other long term (current) drug therapy: Secondary | ICD-10-CM | POA: Insufficient documentation

## 2016-07-19 HISTORY — PX: ROBOTIC ASSISTED BILATERAL SALPINGO OOPHERECTOMY: SHX6078

## 2016-07-19 HISTORY — PX: DILATATION & CURRETTAGE/HYSTEROSCOPY WITH RESECTOCOPE: SHX5572

## 2016-07-19 LAB — GLUCOSE, CAPILLARY
Glucose-Capillary: 156 mg/dL — ABNORMAL HIGH (ref 65–99)
Glucose-Capillary: 153 mg/dL — ABNORMAL HIGH (ref 65–99)

## 2016-07-19 SURGERY — DILATATION & CURETTAGE/HYSTEROSCOPY WITH RESECTOCOPE
Anesthesia: General | Site: Vagina

## 2016-07-19 MED ORDER — LIDOCAINE HCL (CARDIAC) 20 MG/ML IV SOLN
INTRAVENOUS | Status: AC
  Filled 2016-07-19: qty 5

## 2016-07-19 MED ORDER — SCOPOLAMINE 1 MG/3DAYS TD PT72
MEDICATED_PATCH | TRANSDERMAL | Status: AC
  Administered 2016-07-19: 1.5 mg via TRANSDERMAL
  Filled 2016-07-19: qty 1

## 2016-07-19 MED ORDER — FENTANYL CITRATE (PF) 250 MCG/5ML IJ SOLN
INTRAMUSCULAR | Status: DC | PRN
  Administered 2016-07-19 (×2): 100 ug via INTRAVENOUS
  Administered 2016-07-19: 50 ug via INTRAVENOUS
  Administered 2016-07-19: 150 ug via INTRAVENOUS
  Administered 2016-07-19: 100 ug via INTRAVENOUS

## 2016-07-19 MED ORDER — LIDOCAINE HCL (CARDIAC) 20 MG/ML IV SOLN
INTRAVENOUS | Status: DC | PRN
  Administered 2016-07-19: 60 mg via INTRAVENOUS

## 2016-07-19 MED ORDER — DEXAMETHASONE SODIUM PHOSPHATE 10 MG/ML IJ SOLN
INTRAMUSCULAR | Status: DC | PRN
  Administered 2016-07-19: 10 mg via INTRAVENOUS

## 2016-07-19 MED ORDER — SODIUM CHLORIDE 0.9 % IR SOLN
Status: DC | PRN
  Administered 2016-07-19: 3000 mL

## 2016-07-19 MED ORDER — CHLOROPROCAINE HCL 1 % IJ SOLN
INTRAMUSCULAR | Status: AC
  Filled 2016-07-19: qty 30

## 2016-07-19 MED ORDER — ONDANSETRON HCL 4 MG/2ML IJ SOLN
INTRAMUSCULAR | Status: DC | PRN
  Administered 2016-07-19: 4 mg via INTRAVENOUS

## 2016-07-19 MED ORDER — BUPIVACAINE HCL (PF) 0.25 % IJ SOLN
INTRAMUSCULAR | Status: AC
  Filled 2016-07-19: qty 30

## 2016-07-19 MED ORDER — MIDAZOLAM HCL 2 MG/2ML IJ SOLN
INTRAMUSCULAR | Status: AC
  Filled 2016-07-19: qty 2

## 2016-07-19 MED ORDER — SODIUM CHLORIDE 0.9 % IJ SOLN
INTRAMUSCULAR | Status: AC
  Filled 2016-07-19: qty 50

## 2016-07-19 MED ORDER — KETOROLAC TROMETHAMINE 30 MG/ML IJ SOLN
INTRAMUSCULAR | Status: AC
  Filled 2016-07-19: qty 1

## 2016-07-19 MED ORDER — OXYCODONE HCL 5 MG/5ML PO SOLN
5.0000 mg | Freq: Once | ORAL | Status: AC | PRN
Start: 2016-07-19 — End: 2016-07-19

## 2016-07-19 MED ORDER — ONDANSETRON HCL 4 MG/2ML IJ SOLN
INTRAMUSCULAR | Status: AC
  Filled 2016-07-19: qty 2

## 2016-07-19 MED ORDER — KETOROLAC TROMETHAMINE 30 MG/ML IJ SOLN
INTRAMUSCULAR | Status: DC | PRN
  Administered 2016-07-19: 30 mg via INTRAVENOUS

## 2016-07-19 MED ORDER — OXYCODONE HCL 5 MG PO TABS
ORAL_TABLET | ORAL | Status: AC
  Administered 2016-07-19: 5 mg via ORAL
  Filled 2016-07-19: qty 1

## 2016-07-19 MED ORDER — GLYCOPYRROLATE 0.2 MG/ML IJ SOLN
INTRAMUSCULAR | Status: AC
  Filled 2016-07-19: qty 4

## 2016-07-19 MED ORDER — OXYCODONE HCL 5 MG PO TABS
5.0000 mg | ORAL_TABLET | Freq: Once | ORAL | Status: AC | PRN
  Administered 2016-07-19: 5 mg via ORAL

## 2016-07-19 MED ORDER — FENTANYL CITRATE (PF) 100 MCG/2ML IJ SOLN
INTRAMUSCULAR | Status: AC
  Administered 2016-07-19: 25 ug via INTRAVENOUS
  Filled 2016-07-19: qty 2

## 2016-07-19 MED ORDER — SCOPOLAMINE 1 MG/3DAYS TD PT72
1.0000 | MEDICATED_PATCH | Freq: Once | TRANSDERMAL | Status: DC
  Administered 2016-07-19: 1.5 mg via TRANSDERMAL

## 2016-07-19 MED ORDER — SUGAMMADEX SODIUM 200 MG/2ML IV SOLN
INTRAVENOUS | Status: AC
Start: 2016-07-19 — End: ?
  Filled 2016-07-19: qty 2

## 2016-07-19 MED ORDER — LACTATED RINGERS IR SOLN
Status: DC | PRN
  Administered 2016-07-19: 3000 mL

## 2016-07-19 MED ORDER — ROCURONIUM BROMIDE 100 MG/10ML IV SOLN
INTRAVENOUS | Status: DC | PRN
  Administered 2016-07-19: 10 mg via INTRAVENOUS
  Administered 2016-07-19: 20 mg via INTRAVENOUS
  Administered 2016-07-19: 50 mg via INTRAVENOUS

## 2016-07-19 MED ORDER — BUPIVACAINE HCL (PF) 0.25 % IJ SOLN
INTRAMUSCULAR | Status: DC | PRN
  Administered 2016-07-19: 5 mL

## 2016-07-19 MED ORDER — ROPIVACAINE HCL 5 MG/ML IJ SOLN
INTRAMUSCULAR | Status: AC
  Filled 2016-07-19: qty 30

## 2016-07-19 MED ORDER — SUGAMMADEX SODIUM 200 MG/2ML IV SOLN
INTRAVENOUS | Status: DC | PRN
  Administered 2016-07-19: 200 mg via INTRAVENOUS

## 2016-07-19 MED ORDER — PROPOFOL 10 MG/ML IV BOLUS
INTRAVENOUS | Status: AC
  Filled 2016-07-19: qty 20

## 2016-07-19 MED ORDER — DEXAMETHASONE SODIUM PHOSPHATE 10 MG/ML IJ SOLN
INTRAMUSCULAR | Status: AC
  Filled 2016-07-19: qty 1

## 2016-07-19 MED ORDER — MIDAZOLAM HCL 5 MG/5ML IJ SOLN
INTRAMUSCULAR | Status: DC | PRN
  Administered 2016-07-19: 2 mg via INTRAVENOUS

## 2016-07-19 MED ORDER — IBUPROFEN 800 MG PO TABS: 800.0000 mg | ORAL_TABLET | Freq: Three times a day (TID) | ORAL | 0 refills | Status: DC | PRN

## 2016-07-19 MED ORDER — ONDANSETRON HCL 4 MG/2ML IJ SOLN: 4.0000 mg | Freq: Once | INTRAMUSCULAR | Status: DC | PRN

## 2016-07-19 MED ORDER — FENTANYL CITRATE (PF) 100 MCG/2ML IJ SOLN
25.0000 ug | INTRAMUSCULAR | Status: DC | PRN
  Administered 2016-07-19 (×2): 25 ug via INTRAVENOUS

## 2016-07-19 MED ORDER — FENTANYL CITRATE (PF) 250 MCG/5ML IJ SOLN
INTRAMUSCULAR | Status: AC
  Filled 2016-07-19: qty 5

## 2016-07-19 MED ORDER — LACTATED RINGERS IV SOLN
INTRAVENOUS | Status: DC
  Administered 2016-07-19 (×2): via INTRAVENOUS

## 2016-07-19 MED ORDER — NEOSTIGMINE METHYLSULFATE 10 MG/10ML IV SOLN
INTRAVENOUS | Status: AC
  Filled 2016-07-19: qty 1

## 2016-07-19 MED ORDER — PROPOFOL 10 MG/ML IV BOLUS
INTRAVENOUS | Status: DC | PRN
  Administered 2016-07-19: 130 mg via INTRAVENOUS

## 2016-07-19 SURGICAL SUPPLY — 71 items
APPLICATOR ARISTA FLEXITIP XL (MISCELLANEOUS) IMPLANT
BARRIER ADHS 3X4 INTERCEED (GAUZE/BANDAGES/DRESSINGS) IMPLANT
BIPOLAR CUTTING LOOP 21FR (ELECTRODE)
CANISTER SUCT 3000ML (MISCELLANEOUS) ×4 IMPLANT
CANNULA SEAL DVNC (CANNULA) ×4 IMPLANT
CANNULA SEALS DA VINCI (CANNULA) ×4
CATH FOLEY 3WAY  5CC 16FR (CATHETERS) ×2
CATH FOLEY 3WAY 5CC 16FR (CATHETERS) ×2 IMPLANT
CATH ROBINSON RED A/P 16FR (CATHETERS) ×4 IMPLANT
CLOTH BEACON ORANGE TIMEOUT ST (SAFETY) ×4 IMPLANT
CONT PATH 16OZ SNAP LID 3702 (MISCELLANEOUS) ×4 IMPLANT
CONTAINER PREFILL 10% NBF 60ML (FORM) ×8 IMPLANT
COVER BACK TABLE 60X90IN (DRAPES) ×8 IMPLANT
COVER LIGHT HANDLE  1/PK (MISCELLANEOUS) ×2
COVER LIGHT HANDLE 1/PK (MISCELLANEOUS) ×2 IMPLANT
COVER TIP SHEARS 8 DVNC (MISCELLANEOUS) ×2 IMPLANT
COVER TIP SHEARS 8MM DA VINCI (MISCELLANEOUS) ×2
DECANTER SPIKE VIAL GLASS SM (MISCELLANEOUS) ×8 IMPLANT
DEFOGGER SCOPE WARMER CLEARIFY (MISCELLANEOUS) ×4 IMPLANT
DERMABOND ADVANCED (GAUZE/BANDAGES/DRESSINGS) ×2
DERMABOND ADVANCED .7 DNX12 (GAUZE/BANDAGES/DRESSINGS) ×2 IMPLANT
DEVICE MYOSURE LITE (MISCELLANEOUS) ×4 IMPLANT
DRSG OPSITE POSTOP 3X4 (GAUZE/BANDAGES/DRESSINGS) ×4 IMPLANT
DRSG TELFA 3X8 NADH (GAUZE/BANDAGES/DRESSINGS) ×4 IMPLANT
DURAPREP 26ML APPLICATOR (WOUND CARE) ×4 IMPLANT
ELECT REM PT RETURN 9FT ADLT (ELECTROSURGICAL) ×4
ELECTRODE REM PT RTRN 9FT ADLT (ELECTROSURGICAL) ×2 IMPLANT
GAUZE VASELINE 3X9 (GAUZE/BANDAGES/DRESSINGS) IMPLANT
GLOVE BIOGEL PI IND STRL 7.0 (GLOVE) ×10 IMPLANT
GLOVE BIOGEL PI INDICATOR 7.0 (GLOVE) ×10
GLOVE ECLIPSE 6.5 STRL STRAW (GLOVE) ×12 IMPLANT
GOWN STRL REUS W/TWL LRG LVL3 (GOWN DISPOSABLE) ×8 IMPLANT
HEMOSTAT ARISTA ABSORB 3G PWDR (MISCELLANEOUS) IMPLANT
KIT ACCESSORY DA VINCI DISP (KITS)
KIT ACCESSORY DVNC DISP (KITS) IMPLANT
LEGGING LITHOTOMY PAIR STRL (DRAPES) ×4 IMPLANT
LOOP CUTTING BIPOLAR 21FR (ELECTRODE) IMPLANT
NEEDLE INSUFFLATION 150MM (ENDOMECHANICALS) ×4 IMPLANT
NS IRRIG 1000ML POUR BTL (IV SOLUTION) IMPLANT
PACK ROBOT WH (CUSTOM PROCEDURE TRAY) ×4 IMPLANT
PACK ROBOTIC GOWN (GOWN DISPOSABLE) ×4 IMPLANT
PACK TRENDGUARD 450 HYBRID PRO (MISCELLANEOUS) ×2 IMPLANT
PACK VAGINAL MINOR WOMEN LF (CUSTOM PROCEDURE TRAY) ×4 IMPLANT
PAD OB MATERNITY 4.3X12.25 (PERSONAL CARE ITEMS) ×4 IMPLANT
PAD PREP 24X48 CUFFED NSTRL (MISCELLANEOUS) ×4 IMPLANT
POUCH SPECIMEN RETRIEVAL 10MM (ENDOMECHANICALS) ×8 IMPLANT
PROTECTOR NERVE ULNAR (MISCELLANEOUS) ×8 IMPLANT
SEAL ROD LENS SCOPE MYOSURE (ABLATOR) ×4 IMPLANT
SET CYSTO W/LG BORE CLAMP LF (SET/KITS/TRAYS/PACK) IMPLANT
SET IRRIG TUBING LAPAROSCOPIC (IRRIGATION / IRRIGATOR) ×4 IMPLANT
SET TRI-LUMEN FLTR TB AIRSEAL (TUBING) ×4 IMPLANT
SUT VICRYL 0 UR6 27IN ABS (SUTURE) ×8 IMPLANT
SUT VICRYL 4-0 PS2 18IN ABS (SUTURE) ×8 IMPLANT
SYR 50ML LL SCALE MARK (SYRINGE) IMPLANT
SYSTEM CONVERTIBLE TROCAR (TROCAR) ×4 IMPLANT
TIP UTERINE 5.1X6CM LAV DISP (MISCELLANEOUS) IMPLANT
TIP UTERINE 6.7X10CM GRN DISP (MISCELLANEOUS) IMPLANT
TIP UTERINE 6.7X6CM WHT DISP (MISCELLANEOUS) IMPLANT
TIP UTERINE 6.7X8CM BLUE DISP (MISCELLANEOUS) ×4 IMPLANT
TOWEL OR 17X24 6PK STRL BLUE (TOWEL DISPOSABLE) ×12 IMPLANT
TRENDGUARD 450 HYBRID PRO PACK (MISCELLANEOUS) ×4
TROCAR BALLN 12MMX100 BLUNT (TROCAR) ×4 IMPLANT
TROCAR DISP BLADELESS 8 DVNC (TROCAR) ×2 IMPLANT
TROCAR DISP BLADELESS 8MM (TROCAR) ×2
TROCAR PORT AIRSEAL 5X120 (TROCAR) ×4 IMPLANT
TROCAR PORT AIRSEAL 8X120 (TROCAR) IMPLANT
TROCAR Z-THREAD 12X150 (TROCAR) ×4 IMPLANT
TUBING AQUILEX INFLOW (TUBING) ×4 IMPLANT
TUBING AQUILEX OUTFLOW (TUBING) ×4 IMPLANT
WARMER LAPAROSCOPE (MISCELLANEOUS) ×4 IMPLANT
WATER STERILE IRR 1000ML POUR (IV SOLUTION) ×4 IMPLANT

## 2016-07-19 NOTE — Anesthesia Preprocedure Evaluation (Signed)

## 2016-07-19 NOTE — Anesthesia Postprocedure Evaluation (Signed)
Anesthesia Post Note  Patient: Sarah Ali  Procedure(s) Performed: Procedure(s) (LRB): DILATATION & CURETTAGE/HYSTEROSCOPY WITH RESECTION OF ENDOMETRIAL POLYP WITH MYOSURE (N/A) ROBOTIC ASSISTED BILATERAL SALPINGO OOPHORECTOMY AND LYSIS OF ADHESIONS, PELVIC WASHINGS (Bilateral)  Anesthesia Type: General       Last Vitals:  Vitals:   07/19/16 1100 07/19/16 1115  BP: (!) 118/54 113/61  Pulse: 63 69  Resp: 13 16  Temp:      Last Pain:  Vitals:   07/19/16 1045  TempSrc:   PainSc: 5                  Harmony Sandell COKER

## 2016-07-19 NOTE — Brief Op Note (Signed)
07/19/2016  10:04 AM  PATIENT:  Sarah Ali  61 y.o. female  PRE-OPERATIVE DIAGNOSIS:  Endometrial Mass, BRCA 1   POST-OPERATIVE DIAGNOSIS:  Endometrial polyp,,BRCA 1 POSITIVE   PROCEDURE:  DaVinci robotic BSO, lysis of adhesions, dx hysteroscopy, D&C, hysteroscopic resection of endometrial polyps, pelvic washings  SURGEON:  Surgeon(s) and Role:    * Servando Salina, MD - Primary  PHYSICIAN ASSISTANT:   ASSISTANTS: Artelia Laroche, CNM ( robotic portion)  ANESTHESIA:   general Findings; left ant abdominal wall adhesions of omentum, left adnexa obscured by bowel and adhesions, nl right ureter, uterus nl, nl appendix, nl right tube and ovary, nl liver edge, left ovary nl, left tube nl, uterine cavity with large fundal endom polyp and one post LUS polyp  EBL:  Total I/O In: 1300 [I.V.:1300] Out: 295 [Urine:275; Blood:20]  BLOOD ADMINISTERED:none  DRAINS: none   LOCAL MEDICATIONS USED:  MARCAINE     SPECIMEN:  Source of Specimen:  RSO, LSO, pelvic washings, endom polyp with EMC  DISPOSITION OF SPECIMEN:  PATHOLOGY  COUNTS:  YES  TOURNIQUET:  * No tourniquets in log *  DICTATION: .Other Dictation: Dictation Number [id number]  PLAN OF CARE: Discharge to home after PACU  PATIENT DISPOSITION:  PACU - hemodynamically stable.   Delay start of Pharmacological VTE agent (>24hrs) due to surgical blood loss or risk of bleeding: not applicable

## 2016-07-19 NOTE — Anesthesia Postprocedure Evaluation (Signed)
Anesthesia Post Note  Patient: Sarah Ali  Procedure(s) Performed: Procedure(s) (LRB): DILATATION & CURETTAGE/HYSTEROSCOPY WITH RESECTION OF ENDOMETRIAL POLYP WITH MYOSURE (N/A) ROBOTIC ASSISTED BILATERAL SALPINGO OOPHORECTOMY AND LYSIS OF ADHESIONS, PELVIC WASHINGS (Bilateral)  Patient location during evaluation: PACU Anesthesia Type: General Level of consciousness: awake, awake and alert and oriented Pain management: pain level controlled Vital Signs Assessment: post-procedure vital signs reviewed and stable Respiratory status: spontaneous breathing, nonlabored ventilation and respiratory function stable Cardiovascular status: blood pressure returned to baseline Anesthetic complications: no       Last Vitals:  Vitals:   07/19/16 1100 07/19/16 1115  BP: (!) 118/54 113/61  Pulse: 63 69  Resp: 13 16  Temp:      Last Pain:  Vitals:   07/19/16 1045  TempSrc:   PainSc: 5                  Amauria Younts COKER

## 2016-07-19 NOTE — Transfer of Care (Signed)
Immediate Anesthesia Transfer of Care Note  Patient: Sarah Ali  Procedure(s) Performed: Procedure(s) with comments: DILATATION & CURETTAGE/HYSTEROSCOPY WITH RESECTION OF ENDOMETRIAL POLYP WITH MYOSURE (N/A) ROBOTIC ASSISTED BILATERAL SALPINGO OOPHORECTOMY AND LYSIS OF ADHESIONS, PELVIC WASHINGS (Bilateral) - DO NOT DRAPE THE ROBOT   Patient Location: PACU  Anesthesia Type:General  Level of Consciousness: sedated  Airway & Oxygen Therapy: Patient Spontanous Breathing and Patient connected to nasal cannula oxygen  Post-op Assessment: Report given to RN and Post -op Vital signs reviewed and stable  Post vital signs: stable  Last Vitals:  Vitals:   07/19/16 0607  BP: 132/87  Pulse: 66  Resp: 18  Temp: 36.7 C    Last Pain:  Vitals:   07/19/16 0607  TempSrc: Oral      Patients Stated Pain Goal: 5 (23/76/28 3151)  Complications: No apparent anesthesia complications

## 2016-07-19 NOTE — Discharge Instructions (Signed)
°  CALL  IF TEMP>100.4, NOTHING PER VAGINA X 1WK, CALL IF SOAKING A MAXI  PAD EVERY HOUR OR MORE FREQUENTLY  Refer to this sheet in the next few weeks. These instructions provide you with information about caring for yourself after your procedure. Your health care provider may also give you more specific instructions. Your treatment has been planned according to current medical practices, but problems sometimes occur. Call your health care provider if you have any problems or questions after your procedure. What can I expect after the procedure? After the procedure, it is common to have:  A sore throat.  Discomfort in your shoulder.  Mild discomfort or cramping in your abdomen.  Gas pains.  Pain or soreness in the area where the surgical cut (incision) was made.  A bloated feeling.  Tiredness.  Nausea.  Vomiting. Follow these instructions at home: Medicines   Take over-the-counter and prescription medicines only as told by your health care provider.  Do not take aspirin because it can cause bleeding.  Do not drive or operate heavy machinery while taking prescription pain medicine. Activity   Rest for the rest of the day.  Return to your normal activities as told by your health care provider. Ask your health care provider what activities are safe for you. Incision care  General Anesthesia, Adult, Care After These instructions provide you with information about caring for yourself after your procedure. Your health care provider may also give you more specific instructions. Your treatment has been planned according to current medical practices, but problems sometimes occur. Call your health care provider if you have any problems or questions after your procedure. What can I expect after the procedure? After the procedure, it is common to have: Vomiting. A sore throat. Mental slowness. It is common to feel: Nauseous. Cold or shivery. Sleepy. Tired. Sore or achy, even in  parts of your body where you did not have surgery. Follow these instructions at home: For at least 24 hours after the procedure:  Do not: Participate in activities where you could fall or become injured. Drive. Use heavy machinery. Drink alcohol. Take sleeping pills or medicines that cause drowsiness. Make important decisions or sign legal documents. Take care of children on your own. Rest. Eating and drinking  If you vomit, drink water, juice, or soup when you can drink without vomiting. Drink enough fluid to keep your urine clear or pale yellow. Make sure you have little or no nausea before eating solid foods. Follow the diet recommended by your health care provider. General instructions  Have a responsible adult stay with you until you are awake and alert. Return to your normal activities as told by your health care provider. Ask your health care provider what activities are safe for you. Take over-the-counter and prescription medicines only as told by your health care provider. If you smoke, do not smoke without supervision. Keep all follow-up visits as told by your health care provider. This is important.

## 2016-07-19 NOTE — Anesthesia Procedure Notes (Signed)
Procedure Name: Intubation Date/Time: 07/19/2016 7:20 AM Performed by: Ignacia Bayley Pre-anesthesia Checklist: Patient identified, Emergency Drugs available, Suction available and Patient being monitored Patient Re-evaluated:Patient Re-evaluated prior to inductionOxygen Delivery Method: Circle system utilized Preoxygenation: Pre-oxygenation with 100% oxygen Intubation Type: IV induction Ventilation: Mask ventilation without difficulty Laryngoscope Size: Miller and 2 Grade View: Grade II Tube type: Oral Tube size: 7.0 mm Number of attempts: 1 Airway Equipment and Method: Stylet Placement Confirmation: ETT inserted through vocal cords under direct vision,  positive ETCO2 and breath sounds checked- equal and bilateral Secured at: 20 cm Tube secured with: Tape Dental Injury: Teeth and Oropharynx as per pre-operative assessment

## 2016-07-20 ENCOUNTER — Encounter (HOSPITAL_COMMUNITY): Payer: Self-pay | Admitting: Obstetrics and Gynecology

## 2016-07-20 NOTE — Op Note (Signed)
NAME:  Sarah Ali, Sarah Ali                     ACCOUNT NO.:  MEDICAL RECORD NO.:  [id number]  LOCATION:                                 FACILITY:  PHYSICIAN:  Servando Salina, M.D.    DATE OF BIRTH:  DATE OF PROCEDURE:  07/19/2016 DATE OF DISCHARGE:                              OPERATIVE REPORT   PREOPERATIVE DIAGNOSIS:  BRCA1 positive, endometrial mass.  PROCEDURES:  Da Vinci robotic bilateral salpingo-oophorectomy, lysis of adhesions, pelvic washings, diagnostic hysteroscopy, hysteroscopic resection of endometrial polyps using MyoSure, dilation and curettage.  POSTOPERATIVE DIAGNOSIS:  BRCA1 positive,  endometrial polyps.  ANESTHESIA:  General.  SURGEON:  Servando Salina, M.D.  ASSISTANT:  Artelia Laroche, CNM  DESCRIPTION OF PROCEDURE:  Under adequate general anesthesia, the patient was placed in the dorsal lithotomy position.  She was sterilely prepped and draped in usual fashion and an indwelling Foley catheter was sterilely placed.  Examination under anesthesia revealed anteverted uterus.  No adnexal masses could be appreciated.  Bivalve speculum was placed in vagina.  Single-tooth tenaculum was placed on the anterior lip of the cervix.  The cervix was then serially dilated up to #21 [facility] dilator.  A regular hysteroscope was inserted into the uterine cavity. A large polypoid mass arising from the fundus extending into the lower uterine segment and through the internal os was noted as well as a smaller polypoid lesion posteriorly.  Switching to the MyoSure resectoscope, the polypoid lesion was resected in its entirety.  Both tubal ostia could be seen.  The endometrium was otherwise atrophic.  No lesions in endocervical canal were noted.  At that point, the hysteroscope was removed. The cavity was gently curetted for small amount of tissue.  A uterine manipulator was inserted.   Attention was then turned to the abdomen.     An infraumbilical incision was made and carried  down to the rectus fascia. Rectus fascia was opened and extended.  A pursestring suture of 0 Vicryl was then placed and a Hasson cannula was inserted.  The robotic camera was then inserted as well.  A Panoramic inspection was then performed. No trauma on the entry to the abdomen was noted.  Normal liver edge was noted.  It was then noted that both ovaries and tubes were difficulty to see as they were very tented on both sides.  The left could not be seen due to adhesions that extended to the anterior abdominal wall on the left with puckering along one of the adhesions on the anterior wall. Decision was then made at that point to perform robotic surgery.  As such, an AirSeal was placed in the right lower quadrant.  Two robotic ports were placed on either side and inserted under direct visualization.  The robot was then docked to the patient's left side. Pelvic washings were then performed and 350 mL was removed after 500 mL was instilled.  I then placed a PK dissector in arm #2 and monopolar scissors in arm #1.  I then went to the surgical console.  At the surgical console, the procedure was started by lysis of the serosal adhesions on the left side of the anterior abdominal  wall and the puckering was then separately removed off the anterior wall and sent separately with the left adnexa.  The ureters were deep in the pelvis. The left was a little more limited in seeing at the pelvic ring. However, I identified at the left pelvic brim starting proximally the utero-ovarian, the left retroperitoneal space was opened and the left tube and ovary proximally were serially clamped, cauterized, and cut, carried down to the pelvic brim at which time the isolated bundle of ovarian vessels was then serially clamped, cauterized, and cut, removing that adnexa.  On the opposite side, similar procedure was performed. Again identified the ureter as well as extending up to the pelvic brim, isolating those  vessels and serially clamped, cauterized with subsequent removal that adnexa.  Once that was done, the robot was undocked.  The instruments were removed.  I went back to the patient's bedside sterilely. Using an Endobag in the umbilical site and an 8-mm scope, the left specimen was placed.  The right specimen was placed in the bag and  removed. the second specimen, did not get into the bag when it was deployed and it was subsequently removed without a bag through the umbilical site with removal of the Hasson.  Re-insufflation of the abdomen showed again good hemostasis throughout and at that point, all instruments were removed from the abdomen.  The AirSeal was deflated and removed.  Identifying the fascia at the umbilical site, closure of the fascia was then performed with a pursestring and the remaining incision was closed with 4-0 Vicryl subcuticular closures.  Specimen was right and left fallopian tube with ovary sent separately, pelvic washing sent separately, endometrial curettings with polyp sent in total.  Estimated blood loss was minimal.  Complication was none.  The patient tolerated the procedure well, was transferred to recovery room in stable condition.     Servando Salina, M.D.     [employer]MED[employer]  D:  07/20/2016  T:  07/20/2016  Job:  [id number]

## 2016-07-25 ENCOUNTER — Other Ambulatory Visit: Payer: Self-pay | Admitting: Obstetrics and Gynecology

## 2016-09-10 DIAGNOSIS — I2510 Atherosclerotic heart disease of native coronary artery without angina pectoris: Secondary | ICD-10-CM | POA: Diagnosis not present

## 2016-09-10 DIAGNOSIS — Z955 Presence of coronary angioplasty implant and graft: Secondary | ICD-10-CM | POA: Diagnosis not present

## 2016-09-10 DIAGNOSIS — I10 Essential (primary) hypertension: Secondary | ICD-10-CM | POA: Diagnosis not present

## 2016-09-21 DIAGNOSIS — E782 Mixed hyperlipidemia: Secondary | ICD-10-CM | POA: Diagnosis not present

## 2016-09-21 DIAGNOSIS — E1165 Type 2 diabetes mellitus with hyperglycemia: Secondary | ICD-10-CM | POA: Diagnosis not present

## 2016-09-27 DIAGNOSIS — E782 Mixed hyperlipidemia: Secondary | ICD-10-CM | POA: Diagnosis not present

## 2016-09-27 DIAGNOSIS — E1165 Type 2 diabetes mellitus with hyperglycemia: Secondary | ICD-10-CM | POA: Diagnosis not present

## 2016-09-27 DIAGNOSIS — I2510 Atherosclerotic heart disease of native coronary artery without angina pectoris: Secondary | ICD-10-CM | POA: Diagnosis not present

## 2017-02-01 DIAGNOSIS — E1165 Type 2 diabetes mellitus with hyperglycemia: Secondary | ICD-10-CM | POA: Diagnosis not present

## 2017-02-01 DIAGNOSIS — E782 Mixed hyperlipidemia: Secondary | ICD-10-CM | POA: Diagnosis not present

## 2017-02-01 DIAGNOSIS — I10 Essential (primary) hypertension: Secondary | ICD-10-CM | POA: Diagnosis not present

## 2017-02-11 DIAGNOSIS — E1165 Type 2 diabetes mellitus with hyperglycemia: Secondary | ICD-10-CM | POA: Diagnosis not present

## 2017-02-11 DIAGNOSIS — G4730 Sleep apnea, unspecified: Secondary | ICD-10-CM | POA: Diagnosis not present

## 2017-02-11 DIAGNOSIS — Z0000 Encounter for general adult medical examination without abnormal findings: Secondary | ICD-10-CM | POA: Diagnosis not present

## 2017-02-11 DIAGNOSIS — J301 Allergic rhinitis due to pollen: Secondary | ICD-10-CM | POA: Diagnosis not present

## 2017-02-11 DIAGNOSIS — R5383 Other fatigue: Secondary | ICD-10-CM | POA: Diagnosis not present

## 2017-02-11 DIAGNOSIS — E782 Mixed hyperlipidemia: Secondary | ICD-10-CM | POA: Diagnosis not present

## 2017-02-28 ENCOUNTER — Other Ambulatory Visit: Payer: Self-pay | Admitting: Obstetrics and Gynecology

## 2017-02-28 DIAGNOSIS — R9389 Abnormal findings on diagnostic imaging of other specified body structures: Secondary | ICD-10-CM

## 2017-03-19 DIAGNOSIS — H2513 Age-related nuclear cataract, bilateral: Secondary | ICD-10-CM | POA: Diagnosis not present

## 2017-03-19 DIAGNOSIS — E119 Type 2 diabetes mellitus without complications: Secondary | ICD-10-CM | POA: Diagnosis not present

## 2017-03-19 DIAGNOSIS — H10413 Chronic giant papillary conjunctivitis, bilateral: Secondary | ICD-10-CM | POA: Diagnosis not present

## 2017-03-25 DIAGNOSIS — E139 Other specified diabetes mellitus without complications: Secondary | ICD-10-CM | POA: Diagnosis not present

## 2017-03-25 DIAGNOSIS — M21962 Unspecified acquired deformity of left lower leg: Secondary | ICD-10-CM | POA: Diagnosis not present

## 2017-03-25 DIAGNOSIS — M7752 Other enthesopathy of left foot: Secondary | ICD-10-CM | POA: Diagnosis not present

## 2017-03-27 ENCOUNTER — Ambulatory Visit
Admission: RE | Admit: 2017-03-27 | Discharge: 2017-03-27 | Disposition: A | Discharge status: Home / Self Care | Payer: 59 | Source: Ambulatory Visit | Attending: Obstetrics and Gynecology | Admitting: Obstetrics and Gynecology

## 2017-03-27 DIAGNOSIS — R9389 Abnormal findings on diagnostic imaging of other specified body structures: Secondary | ICD-10-CM

## 2017-03-27 DIAGNOSIS — R928 Other abnormal and inconclusive findings on diagnostic imaging of breast: Secondary | ICD-10-CM | POA: Diagnosis not present

## 2017-04-22 ENCOUNTER — Other Ambulatory Visit: Payer: Self-pay | Admitting: Obstetrics and Gynecology

## 2017-04-22 DIAGNOSIS — Z803 Family history of malignant neoplasm of breast: Secondary | ICD-10-CM

## 2017-04-25 DIAGNOSIS — Z01419 Encounter for gynecological examination (general) (routine) without abnormal findings: Secondary | ICD-10-CM | POA: Diagnosis not present

## 2017-05-04 ENCOUNTER — Ambulatory Visit
Admission: RE | Admit: 2017-05-04 | Discharge: 2017-05-04 | Disposition: A | Discharge status: Home / Self Care | Payer: 59 | Source: Ambulatory Visit | Attending: Obstetrics and Gynecology | Admitting: Obstetrics and Gynecology

## 2017-05-04 DIAGNOSIS — Z803 Family history of malignant neoplasm of breast: Secondary | ICD-10-CM

## 2017-05-04 DIAGNOSIS — N6489 Other specified disorders of breast: Secondary | ICD-10-CM | POA: Diagnosis not present

## 2017-05-04 MED ORDER — GADOBENATE DIMEGLUMINE 529 MG/ML IV SOLN
18.0000 mL | Freq: Once | INTRAVENOUS | Status: AC | PRN
  Administered 2017-05-04: 18 mL via INTRAVENOUS

## 2017-06-14 DIAGNOSIS — H2513 Age-related nuclear cataract, bilateral: Secondary | ICD-10-CM | POA: Diagnosis not present

## 2017-06-14 DIAGNOSIS — H00021 Hordeolum internum right upper eyelid: Secondary | ICD-10-CM | POA: Diagnosis not present

## 2017-06-14 DIAGNOSIS — H10413 Chronic giant papillary conjunctivitis, bilateral: Secondary | ICD-10-CM | POA: Diagnosis not present

## 2017-06-24 DIAGNOSIS — E782 Mixed hyperlipidemia: Secondary | ICD-10-CM | POA: Diagnosis not present

## 2017-06-24 DIAGNOSIS — I10 Essential (primary) hypertension: Secondary | ICD-10-CM | POA: Diagnosis not present

## 2017-06-24 DIAGNOSIS — E1165 Type 2 diabetes mellitus with hyperglycemia: Secondary | ICD-10-CM | POA: Diagnosis not present

## 2017-07-01 DIAGNOSIS — E782 Mixed hyperlipidemia: Secondary | ICD-10-CM | POA: Diagnosis not present

## 2017-07-01 DIAGNOSIS — E1165 Type 2 diabetes mellitus with hyperglycemia: Secondary | ICD-10-CM | POA: Diagnosis not present

## 2017-07-01 DIAGNOSIS — I10 Essential (primary) hypertension: Secondary | ICD-10-CM | POA: Diagnosis not present

## 2017-07-03 DIAGNOSIS — H10413 Chronic giant papillary conjunctivitis, bilateral: Secondary | ICD-10-CM | POA: Diagnosis not present

## 2017-07-03 DIAGNOSIS — H00021 Hordeolum internum right upper eyelid: Secondary | ICD-10-CM | POA: Diagnosis not present

## 2017-07-03 DIAGNOSIS — H2513 Age-related nuclear cataract, bilateral: Secondary | ICD-10-CM | POA: Diagnosis not present

## 2017-08-05 IMAGING — MR MR BREAST BIOPSY
6 of 9 series · 33 of 48 positions shown · IV contrast (17ml Multihance)
Comparison: Previous exams.

ADDENDUM:
Pathology revealed fibrocystic change and adenosis in the left
breast. This was found to be concordant by Dr. Angelito Broadbent.
Pathology results were discussed with the patient by telephone. The
patient reported doing well after the biopsy with bruising at the
site. Post biopsy instructions and care were reviewed and questions
were answered. The patient was encouraged to call The [REDACTED] for any additional concerns. The patient was
instructed to return for a bilateral breast MRI in 6 months and
informed that a reminder letter would be sent regarding this
appointment.

Pathology results reported by Balthazar Serafim Sternhof RN, BSN on 07/02/2016.
CLINICAL DATA: The patient is BRCA positive. A recent screening MRI
demonstrated 3 masses in the left breast. It was decided to biopsy
the mass in the lower outer quadrant of the left breast.
EXAM:
MRI GUIDED CORE NEEDLE BIOPSY OF THE LEFT BREAST
TECHNIQUE: Multiplanar, multisequence MR imaging of the left breast was
performed both before and after administration of intravenous
contrast.
CONTRAST:  17mL MULTIHANCE GADOBENATE DIMEGLUMINE 529 MG/ML IV SOLN

[Series 3: axial pre-cm · axial · non-contrast · 1.3mm · 0.73mm/px · z∈[-61,+125]mm · 6 of 144 slices shown]
[im 1/144]
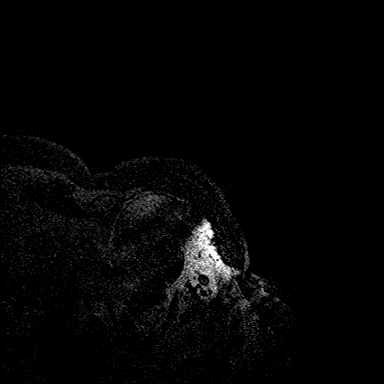
[im 29/144]
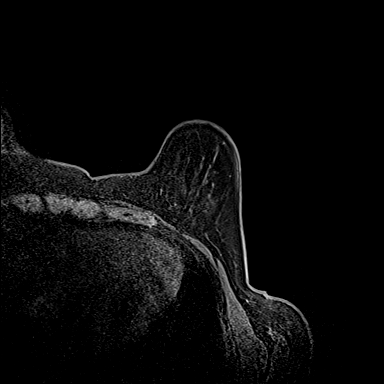
[im 58/144]
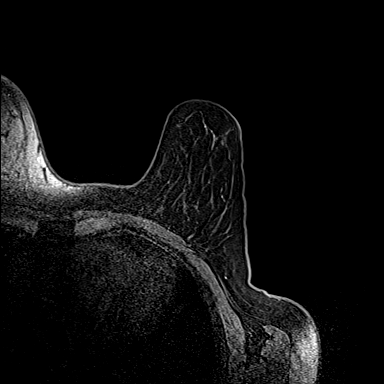
[im 86/144]
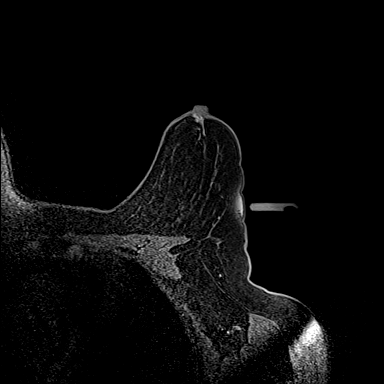
[im 115/144]
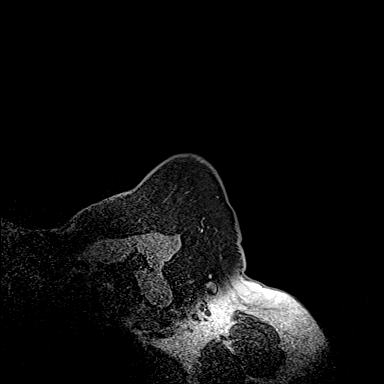
[im 144/144]
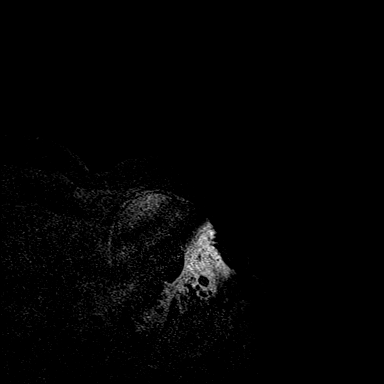

[Series 4: axial post 20 · axial · 1.3mm · 0.73mm/px · z∈[-61,+125]mm · 6 of 144 slices shown (1 of 3)]
[im 1/144]
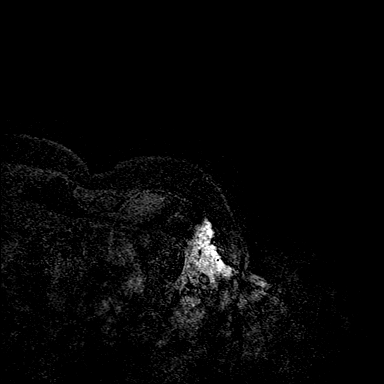
[im 29/144]
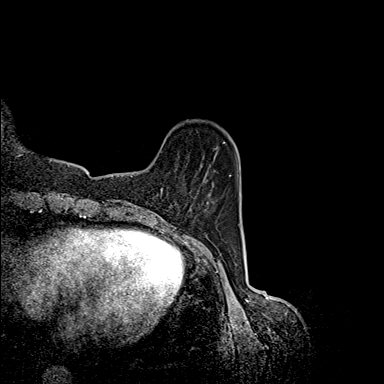
[im 58/144]
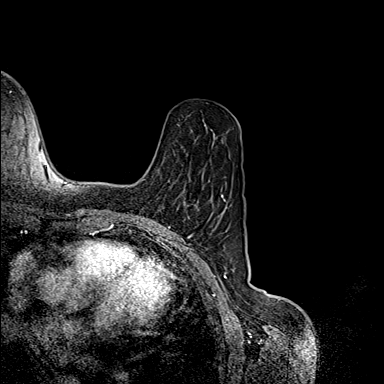
[im 86/144]
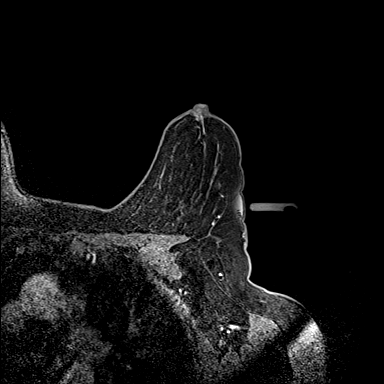
[im 115/144]
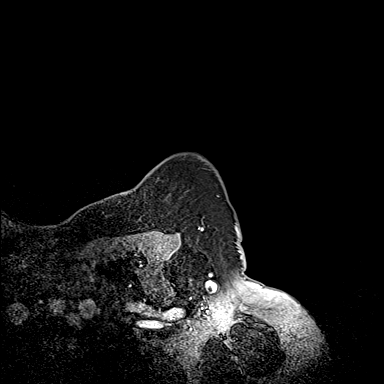
[im 144/144]
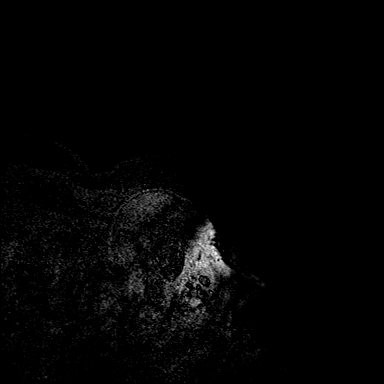

[Series 5: axial post 20 · axial · 1.3mm · 0.73mm/px · z∈[-61,+125]mm · 6 of 144 slices shown (2 of 3)]
[im 1/144]
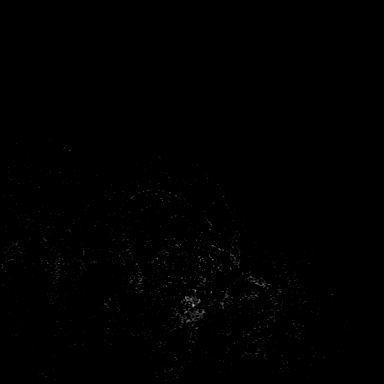
[im 29/144]
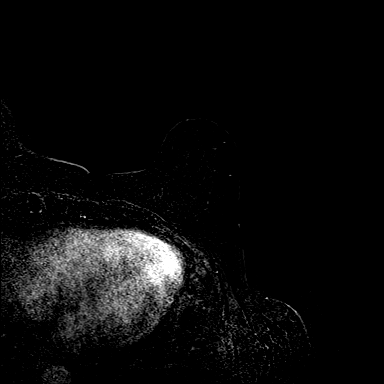
[im 58/144]
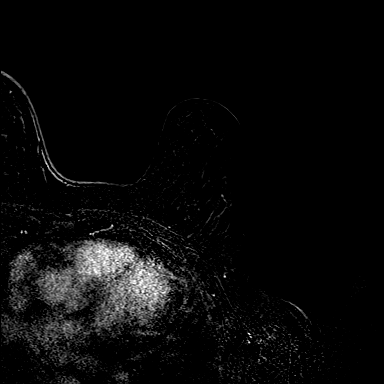
[im 86/144]
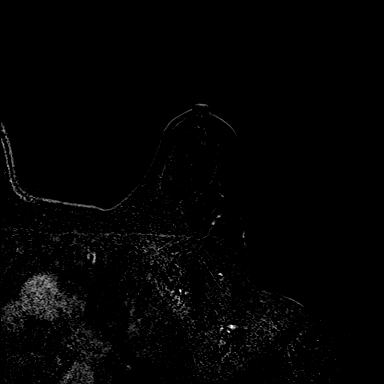
[im 115/144]
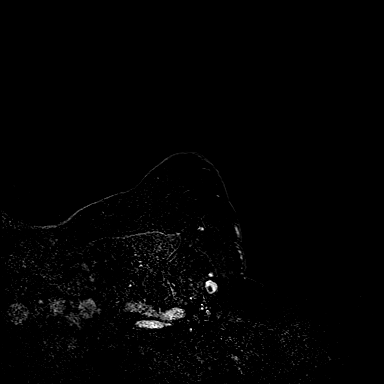
[im 144/144]
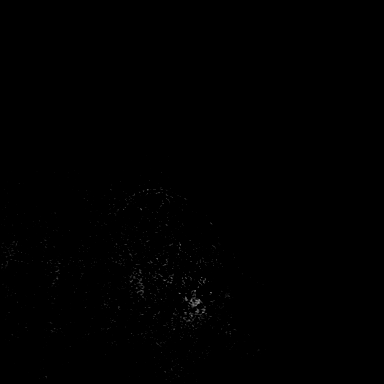

[Series 6: axial pre-cm_s3_(id) · axial · non-contrast · 1.3mm · 0.73mm/px · z∈[-61,+125]mm · 5 of 144 slices shown]
[im 1/144]
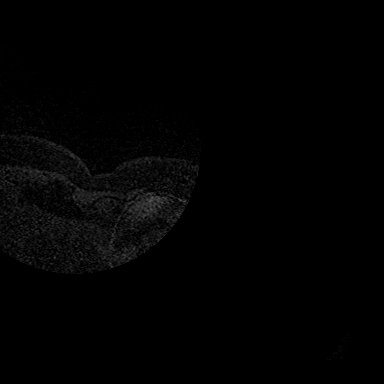
[im 36/144]
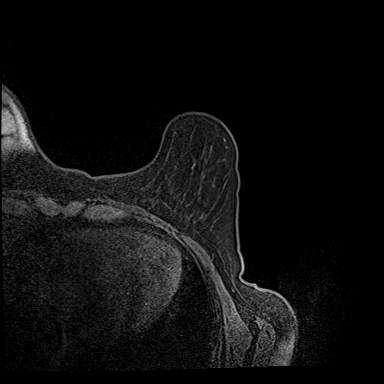
[im 72/144]
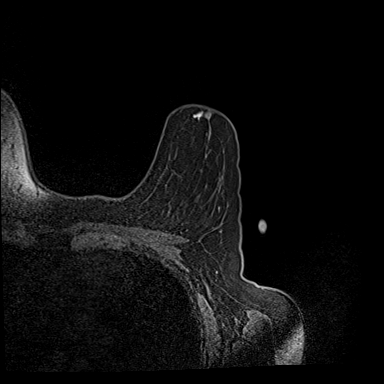
[im 108/144]
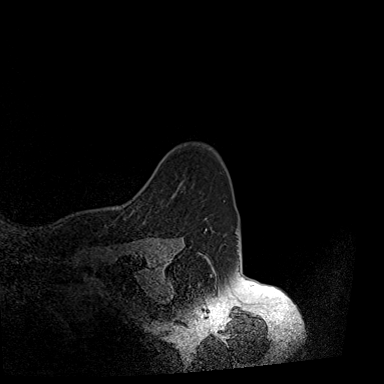
[im 144/144]
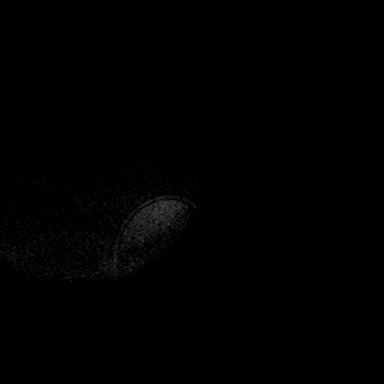

[Series 7: axial post 20 · axial · 1.3mm · 0.73mm/px · z∈[-61,+125]mm · 5 of 144 slices shown (3 of 3)]
[im 1/144]
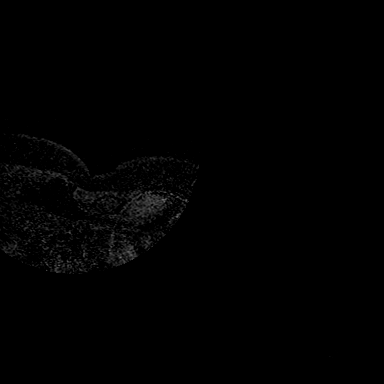
[im 36/144]
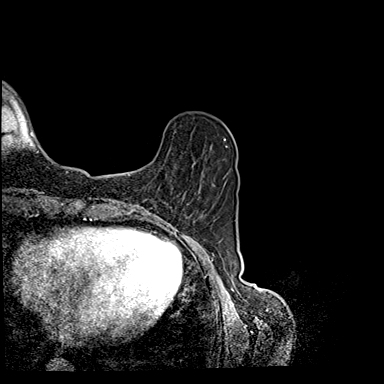
[im 72/144]
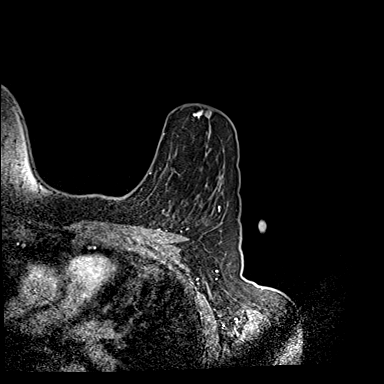
[im 108/144]
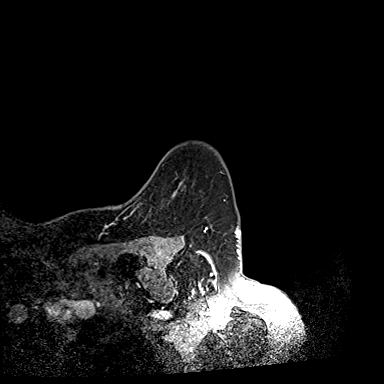
[im 144/144]
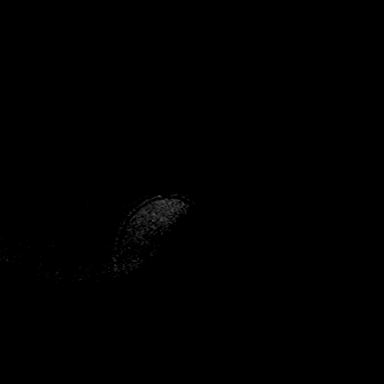

[Series 8: axial confirmation · axial · 1.3mm · 0.73mm/px · z∈[-61,+125]mm · 5 of 144 slices shown]
[im 1/144]
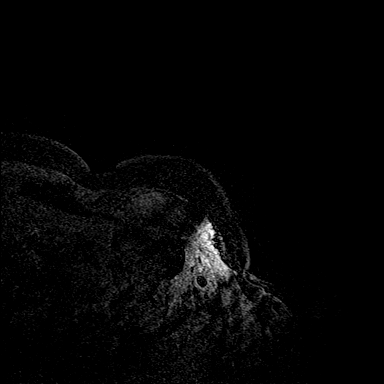
[im 36/144]
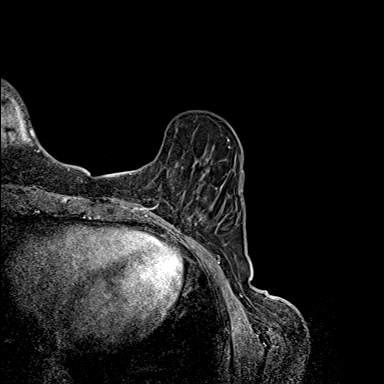
[im 72/144]
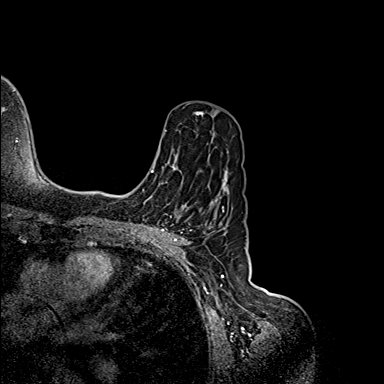
[im 108/144]
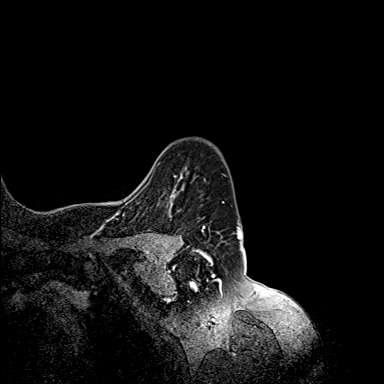
[im 144/144]
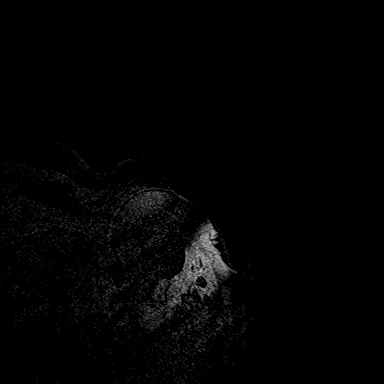

[33 of 48 positions shown; findings below may reference images not displayed]

FINDINGS: I met with the patient, and we discussed the procedure of MRI guided
biopsy, including risks, benefits, and alternatives. Specifically,
we discussed the risks of infection, bleeding, tissue injury, clip
migration, and inadequate sampling. Informed, written consent was
given. The usual time out protocol was performed immediately prior
to the procedure.

Using sterile technique, 1% Lidocaine, MRI guidance, and a 9 gauge
vacuum assisted device, biopsy was performed of a left breast mass
using a lateral approach. At the conclusion of the procedure, a
tissue marker clip was deployed into the biopsy cavity. Follow-up
2-view mammogram was performed and dictated separately.
IMPRESSION: MRI guided biopsy of the mass in the lower outer left breast. No
apparent complications.

## 2017-09-16 DIAGNOSIS — I10 Essential (primary) hypertension: Secondary | ICD-10-CM | POA: Diagnosis not present

## 2017-09-16 DIAGNOSIS — Z955 Presence of coronary angioplasty implant and graft: Secondary | ICD-10-CM | POA: Diagnosis not present

## 2017-09-16 DIAGNOSIS — I2510 Atherosclerotic heart disease of native coronary artery without angina pectoris: Secondary | ICD-10-CM | POA: Diagnosis not present

## 2018-02-10 DIAGNOSIS — E782 Mixed hyperlipidemia: Secondary | ICD-10-CM | POA: Diagnosis not present

## 2018-02-10 DIAGNOSIS — E1165 Type 2 diabetes mellitus with hyperglycemia: Secondary | ICD-10-CM | POA: Diagnosis not present

## 2018-02-17 DIAGNOSIS — I2510 Atherosclerotic heart disease of native coronary artery without angina pectoris: Secondary | ICD-10-CM | POA: Diagnosis not present

## 2018-02-17 DIAGNOSIS — Z0000 Encounter for general adult medical examination without abnormal findings: Secondary | ICD-10-CM | POA: Diagnosis not present

## 2018-02-17 DIAGNOSIS — E1165 Type 2 diabetes mellitus with hyperglycemia: Secondary | ICD-10-CM | POA: Diagnosis not present

## 2018-02-25 DIAGNOSIS — E1165 Type 2 diabetes mellitus with hyperglycemia: Secondary | ICD-10-CM | POA: Diagnosis not present

## 2018-02-25 DIAGNOSIS — I10 Essential (primary) hypertension: Secondary | ICD-10-CM | POA: Diagnosis not present

## 2018-03-12 DIAGNOSIS — E119 Type 2 diabetes mellitus without complications: Secondary | ICD-10-CM | POA: Diagnosis not present

## 2018-03-12 DIAGNOSIS — H10413 Chronic giant papillary conjunctivitis, bilateral: Secondary | ICD-10-CM | POA: Diagnosis not present

## 2018-03-12 DIAGNOSIS — H2513 Age-related nuclear cataract, bilateral: Secondary | ICD-10-CM | POA: Diagnosis not present

## 2018-03-25 DIAGNOSIS — M7752 Other enthesopathy of left foot: Secondary | ICD-10-CM | POA: Diagnosis not present

## 2018-03-25 DIAGNOSIS — M21962 Unspecified acquired deformity of left lower leg: Secondary | ICD-10-CM | POA: Diagnosis not present

## 2018-03-25 DIAGNOSIS — E139 Other specified diabetes mellitus without complications: Secondary | ICD-10-CM | POA: Diagnosis not present

## 2018-05-02 DIAGNOSIS — Z01419 Encounter for gynecological examination (general) (routine) without abnormal findings: Secondary | ICD-10-CM | POA: Diagnosis not present

## 2018-05-02 DIAGNOSIS — Z1231 Encounter for screening mammogram for malignant neoplasm of breast: Secondary | ICD-10-CM | POA: Diagnosis not present

## 2018-05-02 DIAGNOSIS — Z6829 Body mass index (BMI) 29.0-29.9, adult: Secondary | ICD-10-CM | POA: Diagnosis not present

## 2018-05-05 ENCOUNTER — Other Ambulatory Visit: Payer: Self-pay | Admitting: Obstetrics and Gynecology

## 2018-05-05 ENCOUNTER — Ambulatory Visit
Admission: RE | Admit: 2018-05-05 | Discharge: 2018-05-05 | Disposition: A | Discharge status: Home / Self Care | Payer: 59 | Source: Ambulatory Visit | Attending: Obstetrics and Gynecology | Admitting: Obstetrics and Gynecology

## 2018-05-05 DIAGNOSIS — R222 Localized swelling, mass and lump, trunk: Secondary | ICD-10-CM

## 2018-05-05 DIAGNOSIS — R911 Solitary pulmonary nodule: Secondary | ICD-10-CM | POA: Diagnosis not present

## 2018-05-14 DIAGNOSIS — N760 Acute vaginitis: Secondary | ICD-10-CM | POA: Diagnosis not present

## 2018-05-14 DIAGNOSIS — N898 Other specified noninflammatory disorders of vagina: Secondary | ICD-10-CM | POA: Diagnosis not present

## 2018-06-26 DIAGNOSIS — Z719 Counseling, unspecified: Secondary | ICD-10-CM | POA: Diagnosis not present

## 2018-07-03 DIAGNOSIS — Z719 Counseling, unspecified: Secondary | ICD-10-CM | POA: Diagnosis not present

## 2018-07-10 DIAGNOSIS — Z719 Counseling, unspecified: Secondary | ICD-10-CM | POA: Diagnosis not present

## 2018-07-17 DIAGNOSIS — Z719 Counseling, unspecified: Secondary | ICD-10-CM | POA: Diagnosis not present

## 2018-07-24 DIAGNOSIS — Z719 Counseling, unspecified: Secondary | ICD-10-CM | POA: Diagnosis not present

## 2018-07-31 DIAGNOSIS — Z719 Counseling, unspecified: Secondary | ICD-10-CM | POA: Diagnosis not present

## 2018-08-27 DIAGNOSIS — I2510 Atherosclerotic heart disease of native coronary artery without angina pectoris: Secondary | ICD-10-CM | POA: Diagnosis not present

## 2018-08-27 DIAGNOSIS — E782 Mixed hyperlipidemia: Secondary | ICD-10-CM | POA: Diagnosis not present

## 2018-08-27 DIAGNOSIS — E1165 Type 2 diabetes mellitus with hyperglycemia: Secondary | ICD-10-CM | POA: Diagnosis not present

## 2018-09-02 DIAGNOSIS — I2510 Atherosclerotic heart disease of native coronary artery without angina pectoris: Secondary | ICD-10-CM | POA: Diagnosis not present

## 2018-09-02 DIAGNOSIS — E1165 Type 2 diabetes mellitus with hyperglycemia: Secondary | ICD-10-CM | POA: Diagnosis not present

## 2018-09-02 DIAGNOSIS — E782 Mixed hyperlipidemia: Secondary | ICD-10-CM | POA: Diagnosis not present

## 2019-11-11 ENCOUNTER — Other Ambulatory Visit: Payer: Self-pay

## 2019-11-11 ENCOUNTER — Emergency Department (HOSPITAL_COMMUNITY): Payer: 59

## 2019-11-11 ENCOUNTER — Emergency Department (HOSPITAL_COMMUNITY)
Admission: EM | Admit: 2019-11-11 | Discharge: 2019-11-12 | Disposition: A | Discharge status: Home / Self Care | Payer: 59 | Attending: Emergency Medicine | Admitting: Emergency Medicine

## 2019-11-11 ENCOUNTER — Encounter (HOSPITAL_COMMUNITY): Payer: Self-pay

## 2019-11-11 DIAGNOSIS — R0789 Other chest pain: Secondary | ICD-10-CM | POA: Insufficient documentation

## 2019-11-11 DIAGNOSIS — Z7984 Long term (current) use of oral hypoglycemic drugs: Secondary | ICD-10-CM | POA: Diagnosis not present

## 2019-11-11 DIAGNOSIS — E119 Type 2 diabetes mellitus without complications: Secondary | ICD-10-CM | POA: Insufficient documentation

## 2019-11-11 DIAGNOSIS — I10 Essential (primary) hypertension: Secondary | ICD-10-CM | POA: Diagnosis not present

## 2019-11-11 DIAGNOSIS — I2510 Atherosclerotic heart disease of native coronary artery without angina pectoris: Secondary | ICD-10-CM | POA: Diagnosis not present

## 2019-11-11 DIAGNOSIS — Z79899 Other long term (current) drug therapy: Secondary | ICD-10-CM | POA: Insufficient documentation

## 2019-11-11 LAB — CBC
HCT: 39.2 % (ref 36.0–46.0)
Hemoglobin: 13.3 g/dL (ref 12.0–15.0)
MCH: 29.8 pg (ref 26.0–34.0)
MCHC: 33.9 g/dL (ref 30.0–36.0)
MCV: 87.7 fL (ref 80.0–100.0)
Platelets: 247 10*3/uL (ref 150–400)
RBC: 4.47 MIL/uL (ref 3.87–5.11)
RDW: 12.3 % (ref 11.5–15.5)
WBC: 10.4 10*3/uL (ref 4.0–10.5)
nRBC: 0 % (ref 0.0–0.2)

## 2019-11-11 LAB — BASIC METABOLIC PANEL
Anion gap: 11 (ref 5–15)
BUN: 26 mg/dL — ABNORMAL HIGH (ref 8–23)
CO2: 26 mmol/L (ref 22–32)
Calcium: 9.2 mg/dL (ref 8.9–10.3)
Chloride: 103 mmol/L (ref 98–111)
Creatinine, Ser: 0.89 mg/dL (ref 0.44–1.00)
GFR calc Af Amer: >60 mL/min (ref >60)
GFR calc non Af Amer: >60 mL/min (ref >60)
Glucose, Bld: 178 mg/dL — ABNORMAL HIGH (ref 70–99)
Potassium: 3.9 mmol/L (ref 3.5–5.1)
Sodium: 140 mmol/L (ref 135–145)

## 2019-11-11 LAB — TROPONIN I (HIGH SENSITIVITY)
Troponin I (High Sensitivity): 9 ng/L (ref <18)
Troponin I (High Sensitivity): 9 ng/L (ref <18)

## 2019-11-11 NOTE — ED Triage Notes (Signed)
Pt arrives POV for eval of centralized sharp chest pain onset yesterday AM. Pt reports that she noted the chest pain while weeding yesterday, and again this AM, and this afternoon. Pt then called MD who advised her to present here. Hx of stent placed at [facility] 6 years ago, but states no issues since.

## 2019-11-12 LAB — TROPONIN I (HIGH SENSITIVITY): Troponin I (High Sensitivity): 7 ng/L (ref <18)

## 2019-11-12 LAB — D-DIMER, QUANTITATIVE: D-Dimer, Quant: 0.34 ug/mL-FEU (ref 0.00–0.50)

## 2019-11-12 NOTE — Discharge Instructions (Signed)
Aloe up closely with your cardiologist as an outpatient.  Your work-up here is reassuring.

## 2019-11-12 NOTE — ED Notes (Signed)
Patient verbalizes understanding of discharge instructions. Opportunity for questioning and answers were provided. Armband removed by staff, pt discharged from ED stable & ambulatory  

## 2019-11-12 NOTE — ED Provider Notes (Signed)
[facility] EMERGENCY DEPARTMENT Provider Note   CSN: [id number] Arrival date & time: 11/11/19  1550     History Chief Complaint  Patient presents with  . Chest Pain    Sarah Ali is a 64 y.o. female.  HPI     This is a 64 year old female with history of coronary artery disease status post stent placement, diabetes, hypertension hypercholesterolemia who presents with chest pain.  Patient reports that over the last 1 to 2 days she has noted some pressure-like anterior chest pain that has been nonradiating.  She believes it may be associated with exertion as she has experienced it while gardening and doing tasks around the house.  She has not noted it at rest.  She is not currently having any pain.  Last episode of chest pain was yesterday afternoon.  She denies any shortness of breath, diaphoresis, fevers, cough.  No history of blood clots.  She has noted some right ankle and lower extremity swelling.  No known trauma.  Past Medical History:  Diagnosis Date  . Asthma    childhood  . BRCA1 positive   . Bronchitis   . Diabetes mellitus without complication ([place])   . Eczema   . Family history of breast cancer   . Family history of ovarian cancer   . High cholesterol   . Hypertension   . Pneumonia     Patient Active Problem List   Diagnosis Date Noted  . BRCA1 positive   . Family history of breast cancer   . Family history of ovarian cancer     Past Surgical History:  Procedure Laterality Date  . BREAST BIOPSY    . BREAST SURGERY  07/05/2016   biopsy  . COLONOSCOPY    . CORONARY ANGIOPLASTY  2015  . DILATATION & CURRETTAGE/HYSTEROSCOPY WITH RESECTOCOPE N/A 07/19/2016   Procedure: DILATATION & CURETTAGE/HYSTEROSCOPY WITH RESECTION OF ENDOMETRIAL POLYP WITH MYOSURE;  Surgeon: Servando Salina, MD;  Location: [facility];  Service: Gynecology;  Laterality: N/A;  . ROBOTIC ASSISTED BILATERAL SALPINGO OOPHERECTOMY Bilateral 07/19/2016   Procedure: ROBOTIC  ASSISTED BILATERAL SALPINGO OOPHORECTOMY AND LYSIS OF ADHESIONS, PELVIC WASHINGS;  Surgeon: Servando Salina, MD;  Location: [facility];  Service: Gynecology;  Laterality: Bilateral;  DO NOT DRAPE THE ROBOT      OB History   No obstetric history on file.     Family History  Problem Relation Age of Onset  . Colon cancer Mother 34  . Ovarian cancer Mother 48  . Skin cancer Father   . BRCA 1/2 Brother        BRCA1 negative  . Breast cancer Maternal Aunt        dx in her 37s and again at 38  . BRCA 1/2 Maternal Aunt        BRCA1 positive  . Colon cancer Paternal Aunt        dx in her 69s  . Breast cancer Maternal Grandmother   . Parkinson's disease Maternal Grandfather 41    Social History   Tobacco Use  . Smoking status: Never Smoker  . Smokeless tobacco: Never Used  Substance Use Topics  . Alcohol use: Yes    Comment: occ  . Drug use: No    Home Medications Prior to Admission medications   Medication Sig Start Date End Date Taking? Authorizing Provider  amLODipine-olmesartan (AZOR) 5-40 MG per tablet Take 1 tablet by mouth daily.   Yes [provider]  aspirin 81 MG tablet Take 81  mg by mouth daily.   Yes [provider]  atorvastatin (LIPITOR) 40 MG tablet Take 40 mg by mouth daily at 6 PM.   Yes [provider]  Cholecalciferol (VITAMIN D) 2000 units tablet Take 2,000 Units by mouth daily.   Yes [provider]  empagliflozin (JARDIANCE) 25 MG TABS tablet Take 25 mg by mouth daily.   Yes [provider]  fexofenadine (ALLEGRA) 60 MG tablet Take 60 mg by mouth daily as needed for allergies or rhinitis.   Yes [provider]  hydrocortisone cream 1 % Apply 1 application topically 2 (two) times daily as needed for itching.   Yes [provider]  JANUVIA 100 MG tablet Take 100 mg by mouth daily. 09/21/19  Yes [provider]  metFORMIN (GLUMETZA) 500 MG (MOD) 24 hr tablet Take 1,000 mg by mouth 2 (two)  times daily with a meal.    Yes [provider]  methylPREDNISolone (MEDROL DOSEPAK) 4 MG TBPK tablet Take 4-24 mg by mouth See admin instructions. Take 6 tablets on day 1 then decrease by 1 tablet daily until all taken 11/09/19  Yes [provider]  Multiple Vitamins-Minerals (MULTIVITAMIN WITH MINERALS) tablet Take 1 tablet by mouth daily.   Yes [provider]  nitroGLYCERIN (NITROSTAT) 0.4 MG SL tablet Place 0.4 mg under the tongue every 5 (five) minutes as needed for chest pain.   Yes [provider]  tobramycin (TOBREX) 0.3 % ophthalmic solution Place 2 drops into the left eye 4 (four) times daily. Started 11/09/19 11/09/19  Yes [provider]  vitamin E 400 UNIT capsule Take 400 Units by mouth daily.   Yes [provider]  ibuprofen (ADVIL,MOTRIN) 800 MG tablet Take 1 tablet (800 mg total) by mouth every 8 (eight) hours as needed. Patient not taking: Reported on 11/12/2019 07/19/16   Servando Salina, MD    Allergies    Patient has no known allergies.  Review of Systems   Review of Systems  Constitutional: Negative for fever.  Respiratory: Negative for cough and shortness of breath.   Cardiovascular: Positive for chest pain and leg swelling.  Gastrointestinal: Negative for abdominal pain, diarrhea, nausea and vomiting.  All other systems reviewed and are negative.   Physical Exam Updated Vital Signs BP (!) 130/53   Pulse 64   Temp (!) 97.1 F (36.2 C) (Oral)   Resp 20   Ht 1.676 m (_0 )   Wt 81.6 kg   SpO2 100%   BMI 29.05 kg/m   Physical Exam Vitals and nursing note reviewed.  Constitutional:      Appearance: She is well-developed. She is not ill-appearing.  HENT:     Head: Normocephalic and atraumatic.  Eyes:     Pupils: Pupils are equal, round, and reactive to light.  Cardiovascular:     Rate and Rhythm: Normal rate and regular rhythm.     Heart sounds: Normal heart sounds.  Pulmonary:     Effort: Pulmonary  effort is normal. No respiratory distress.     Breath sounds: No wheezing.  Chest:     Chest wall: No tenderness.  Abdominal:     General: Bowel sounds are normal.     Palpations: Abdomen is soft.  Musculoskeletal:     Cervical back: Neck supple.     Right lower leg: No tenderness. No edema.     Left lower leg: No tenderness. No edema.  Skin:    General: Skin is warm and dry.  Neurological:     Mental Status: She is alert and oriented to person, place, and time.  Psychiatric:        Mood and Affect: Mood normal.     ED Results / Procedures / Treatments   Labs (all labs ordered are listed, but only abnormal results are displayed) Labs Reviewed  BASIC METABOLIC PANEL - Abnormal; Notable for the following components:      Result Value   Glucose, Bld 178 (*)    BUN 26 (*)    All other components within normal limits  CBC  D-DIMER, QUANTITATIVE (NOT AT [facility])  TROPONIN I (HIGH SENSITIVITY)  TROPONIN I (HIGH SENSITIVITY)  TROPONIN I (HIGH SENSITIVITY)  TROPONIN I (HIGH SENSITIVITY)    EKG EKG Interpretation  Date/Time:  Wednesday November 11 2019 15:59:11 EDT Ventricular Rate:  79 PR Interval:  128 QRS Duration: 78 QT Interval:  376 QTC Calculation: 431 R Axis:   -18 Text Interpretation: Normal sinus rhythm Nonspecific ST abnormality Abnormal ECG Confirmed by Thayer Jew [phone]) on 11/12/2019 12:51:17 AM   Radiology DG Chest 2 View  Result Date: 11/11/2019 CLINICAL DATA:  Chest pain. EXAM: CHEST - 2 VIEW COMPARISON:  May 05, 2018 FINDINGS: There is no evidence of acute infiltrate, pleural effusion or pneumothorax. The heart size and mediastinal contours are within normal limits. The visualized skeletal structures are unremarkable. IMPRESSION: No active cardiopulmonary disease. Electronically Signed   By: Virgina Norfolk M.D.   On: 11/11/2019 17:28    Procedures Procedures (including critical care time)  Medications Ordered in ED Medications - No data to  display  ED Course  I have reviewed the triage vital signs and the nursing notes.  Pertinent labs & imaging results that were available during my care of the patient were reviewed by me and considered in my medical decision making (see chart for details).    MDM Rules/Calculators/A&P                          Patient presents with chest pain.  She reports that it is anterior nonradiating.  She is overall nontoxic and vital signs are reassuring.  She does have a history of coronary artery disease but states that this pain is different from when she had her stent placed.  Upon my evaluation she had already had 2 - troponins that were stable.  EKG shows no acute ischemic changes.  She does report some right lower extremity swelling without injury.  PE is also consideration.  Chest x-ray shows no evidence of pneumothorax or pneumonia.  We will send screening D-dimer and third troponin for restratification.  Patient remained chest pain-free and on the cardiac monitor while here in the emergency room.  She did not have any further events.  Third troponin and D-dimer are reassuring.  Discussed with patient's her results.  Feel is reasonable for her to follow-up with her outpatient cardiologist.  Patient is agreeable to plan.  After history, exam, and medical workup I feel the patient has been appropriately medically screened and is safe for discharge home. Pertinent diagnoses were discussed with the patient. Patient was given return precautions.    Final Clinical Impression(s) / ED Diagnoses Final diagnoses:  Atypical chest pain    Rx / DC Orders ED Discharge Orders    None       Belal Scallon, Barbette Hair, MD 11/12/19 [phone]

## 2019-11-16 ENCOUNTER — Other Ambulatory Visit: Payer: Self-pay

## 2019-11-16 ENCOUNTER — Inpatient Hospital Stay (HOSPITAL_COMMUNITY)
Admission: EM | Admit: 2019-11-16 | Discharge: 2019-11-18 | DRG: 246 | Disposition: A | Discharge status: Home / Self Care | Payer: 59 | Attending: Internal Medicine | Admitting: Internal Medicine

## 2019-11-16 ENCOUNTER — Encounter (HOSPITAL_COMMUNITY)
Admission: EM | Disposition: A | Discharge status: Home / Self Care | Payer: Self-pay | Source: Home / Self Care | Attending: Internal Medicine

## 2019-11-16 ENCOUNTER — Inpatient Hospital Stay (HOSPITAL_COMMUNITY): Payer: 59

## 2019-11-16 ENCOUNTER — Encounter (HOSPITAL_COMMUNITY): Payer: Self-pay | Admitting: Internal Medicine

## 2019-11-16 ENCOUNTER — Ambulatory Visit (HOSPITAL_COMMUNITY): Admit: 2019-11-16 | Payer: 59 | Admitting: Internal Medicine

## 2019-11-16 DIAGNOSIS — Z8041 Family history of malignant neoplasm of ovary: Secondary | ICD-10-CM | POA: Diagnosis not present

## 2019-11-16 DIAGNOSIS — Z79899 Other long term (current) drug therapy: Secondary | ICD-10-CM

## 2019-11-16 DIAGNOSIS — I313 Pericardial effusion (noninflammatory): Secondary | ICD-10-CM | POA: Diagnosis present

## 2019-11-16 DIAGNOSIS — Z7982 Long term (current) use of aspirin: Secondary | ICD-10-CM

## 2019-11-16 DIAGNOSIS — E119 Type 2 diabetes mellitus without complications: Secondary | ICD-10-CM | POA: Diagnosis present

## 2019-11-16 DIAGNOSIS — Z20822 Contact with and (suspected) exposure to covid-19: Secondary | ICD-10-CM | POA: Diagnosis present

## 2019-11-16 DIAGNOSIS — I213 ST elevation (STEMI) myocardial infarction of unspecified site: Secondary | ICD-10-CM | POA: Diagnosis present

## 2019-11-16 DIAGNOSIS — I2119 ST elevation (STEMI) myocardial infarction involving other coronary artery of inferior wall: Secondary | ICD-10-CM | POA: Diagnosis present

## 2019-11-16 DIAGNOSIS — I2510 Atherosclerotic heart disease of native coronary artery without angina pectoris: Secondary | ICD-10-CM

## 2019-11-16 DIAGNOSIS — I2111 ST elevation (STEMI) myocardial infarction involving right coronary artery: Secondary | ICD-10-CM

## 2019-11-16 DIAGNOSIS — E785 Hyperlipidemia, unspecified: Secondary | ICD-10-CM | POA: Diagnosis present

## 2019-11-16 DIAGNOSIS — E1159 Type 2 diabetes mellitus with other circulatory complications: Secondary | ICD-10-CM | POA: Diagnosis not present

## 2019-11-16 DIAGNOSIS — Z800 Family history of malignant neoplasm of digestive organs: Secondary | ICD-10-CM | POA: Diagnosis not present

## 2019-11-16 DIAGNOSIS — Z955 Presence of coronary angioplasty implant and graft: Secondary | ICD-10-CM

## 2019-11-16 DIAGNOSIS — Z1501 Genetic susceptibility to malignant neoplasm of breast: Secondary | ICD-10-CM | POA: Diagnosis not present

## 2019-11-16 DIAGNOSIS — I10 Essential (primary) hypertension: Secondary | ICD-10-CM | POA: Diagnosis present

## 2019-11-16 DIAGNOSIS — Z7984 Long term (current) use of oral hypoglycemic drugs: Secondary | ICD-10-CM

## 2019-11-16 DIAGNOSIS — T82855A Stenosis of coronary artery stent, initial encounter: Secondary | ICD-10-CM | POA: Diagnosis present

## 2019-11-16 DIAGNOSIS — R001 Bradycardia, unspecified: Secondary | ICD-10-CM | POA: Diagnosis present

## 2019-11-16 DIAGNOSIS — I959 Hypotension, unspecified: Secondary | ICD-10-CM | POA: Diagnosis present

## 2019-11-16 DIAGNOSIS — Z803 Family history of malignant neoplasm of breast: Secondary | ICD-10-CM | POA: Diagnosis not present

## 2019-11-16 HISTORY — PX: LEFT HEART CATH AND CORONARY ANGIOGRAPHY: CATH118249

## 2019-11-16 HISTORY — PX: CORONARY/GRAFT ACUTE MI REVASCULARIZATION: CATH118305

## 2019-11-16 LAB — COMPREHENSIVE METABOLIC PANEL
ALT: 64 U/L — ABNORMAL HIGH (ref 0–44)
AST: 34 U/L (ref 15–41)
Albumin: 3.4 g/dL — ABNORMAL LOW (ref 3.5–5.0)
Alkaline Phosphatase: 111 U/L (ref 38–126)
Anion gap: 15 (ref 5–15)
BUN: 15 mg/dL (ref 8–23)
CO2: 19 mmol/L — ABNORMAL LOW (ref 22–32)
Calcium: 8.7 mg/dL — ABNORMAL LOW (ref 8.9–10.3)
Chloride: 105 mmol/L (ref 98–111)
Creatinine, Ser: 0.79 mg/dL (ref 0.44–1.00)
GFR calc Af Amer: >60 mL/min (ref >60)
GFR calc non Af Amer: >60 mL/min (ref >60)
Glucose, Bld: 265 mg/dL — ABNORMAL HIGH (ref 70–99)
Potassium: 3.5 mmol/L (ref 3.5–5.1)
Sodium: 139 mmol/L (ref 135–145)
Total Bilirubin: 0.8 mg/dL (ref 0.3–1.2)
Total Protein: 6 g/dL — ABNORMAL LOW (ref 6.5–8.1)

## 2019-11-16 LAB — HEMOGLOBIN A1C
Hgb A1c MFr Bld: 6.8 % — ABNORMAL HIGH (ref 4.8–5.6)
Mean Plasma Glucose: 148.46 mg/dL

## 2019-11-16 LAB — POCT ACTIVATED CLOTTING TIME
Activated Clotting Time: 191 seconds
Activated Clotting Time: 334 seconds

## 2019-11-16 LAB — LIPID PANEL
Cholesterol: 186 mg/dL (ref 0–200)
HDL: 44 mg/dL (ref >40)
LDL Cholesterol: 85 mg/dL (ref 0–99)
Total CHOL/HDL Ratio: 4.2 RATIO
Triglycerides: 285 mg/dL — ABNORMAL HIGH (ref <150)
VLDL: 57 mg/dL — ABNORMAL HIGH (ref 0–40)

## 2019-11-16 LAB — CBC
HCT: 38.8 % (ref 36.0–46.0)
Hemoglobin: 13.6 g/dL (ref 12.0–15.0)
MCH: 29.9 pg (ref 26.0–34.0)
MCHC: 35.1 g/dL (ref 30.0–36.0)
MCV: 85.3 fL (ref 80.0–100.0)
Platelets: 208 10*3/uL (ref 150–400)
RBC: 4.55 MIL/uL (ref 3.87–5.11)
RDW: 12.2 % (ref 11.5–15.5)
WBC: 6.5 10*3/uL (ref 4.0–10.5)
nRBC: 0 % (ref 0.0–0.2)

## 2019-11-16 LAB — SARS CORONAVIRUS 2 BY RT PCR (HOSPITAL ORDER, PERFORMED IN ~~LOC~~ HOSPITAL LAB): SARS Coronavirus 2: NEGATIVE

## 2019-11-16 LAB — GLUCOSE, CAPILLARY
Glucose-Capillary: 176 mg/dL — ABNORMAL HIGH (ref 70–99)
Glucose-Capillary: 266 mg/dL — ABNORMAL HIGH (ref 70–99)

## 2019-11-16 LAB — TROPONIN I (HIGH SENSITIVITY)
Troponin I (High Sensitivity): 95 ng/L — ABNORMAL HIGH (ref <18)
Troponin I (High Sensitivity): 11625 ng/L (ref <18)

## 2019-11-16 LAB — APTT: aPTT: 21 seconds — ABNORMAL LOW (ref 24–36)

## 2019-11-16 LAB — PROTIME-INR
INR: 1 (ref 0.8–1.2)
Prothrombin Time: 12.8 seconds (ref 11.4–15.2)

## 2019-11-16 SURGERY — LEFT HEART CATH AND CORONARY ANGIOGRAPHY
Anesthesia: LOCAL

## 2019-11-16 MED ORDER — SODIUM CHLORIDE 0.9 % IV BOLUS
500.0000 mL | Freq: Once | INTRAVENOUS | Status: AC
  Administered 2019-11-16: 500 mL via INTRAVENOUS

## 2019-11-16 MED ORDER — FENTANYL CITRATE (PF) 100 MCG/2ML IJ SOLN
INTRAMUSCULAR | Status: DC | PRN
  Administered 2019-11-16 (×2): 25 ug via INTRAVENOUS

## 2019-11-16 MED ORDER — NITROGLYCERIN 1 MG/10 ML FOR IR/CATH LAB
INTRA_ARTERIAL | Status: AC
  Filled 2019-11-16: qty 20

## 2019-11-16 MED ORDER — LABETALOL HCL 5 MG/ML IV SOLN: 10.0000 mg | INTRAVENOUS | Status: AC | PRN

## 2019-11-16 MED ORDER — IRBESARTAN 300 MG PO TABS: 300.0000 mg | ORAL_TABLET | Freq: Every day | ORAL | Status: DC

## 2019-11-16 MED ORDER — SODIUM CHLORIDE 0.9 % IV SOLN: 250.0000 mL | INTRAVENOUS | Status: DC | PRN

## 2019-11-16 MED ORDER — HEPARIN SODIUM (PORCINE) 1000 UNIT/ML IJ SOLN
INTRAMUSCULAR | Status: AC
  Filled 2019-11-16: qty 1

## 2019-11-16 MED ORDER — ENOXAPARIN SODIUM 40 MG/0.4ML ~~LOC~~ SOLN
40.0000 mg | SUBCUTANEOUS | Status: DC
  Administered 2019-11-17 – 2019-11-18 (×2): 40 mg via SUBCUTANEOUS
  Filled 2019-11-16 (×2): qty 0.4

## 2019-11-16 MED ORDER — AMLODIPINE-OLMESARTAN 5-40 MG PO TABS: 1.0000 | ORAL_TABLET | Freq: Every day | ORAL | Status: DC

## 2019-11-16 MED ORDER — VERAPAMIL HCL 2.5 MG/ML IV SOLN
INTRAVENOUS | Status: AC
  Filled 2019-11-16: qty 2

## 2019-11-16 MED ORDER — ASPIRIN EC 81 MG PO TBEC
81.0000 mg | DELAYED_RELEASE_TABLET | Freq: Every day | ORAL | Status: DC
  Administered 2019-11-17 – 2019-11-18 (×2): 81 mg via ORAL
  Filled 2019-11-16 (×2): qty 1

## 2019-11-16 MED ORDER — LIDOCAINE HCL (PF) 1 % IJ SOLN
INTRAMUSCULAR | Status: AC
  Filled 2019-11-16: qty 60

## 2019-11-16 MED ORDER — EMPAGLIFLOZIN 25 MG PO TABS
25.0000 mg | ORAL_TABLET | Freq: Every day | ORAL | Status: DC
  Filled 2019-11-16: qty 1

## 2019-11-16 MED ORDER — COLCHICINE 0.6 MG PO TABS
0.6000 mg | ORAL_TABLET | Freq: Two times a day (BID) | ORAL | Status: DC
  Administered 2019-11-16: 0.6 mg via ORAL
  Filled 2019-11-16 (×2): qty 1

## 2019-11-16 MED ORDER — LIDOCAINE HCL (PF) 1 % IJ SOLN
INTRAMUSCULAR | Status: DC | PRN
  Administered 2019-11-16: 2 mL

## 2019-11-16 MED ORDER — PRASUGREL HCL 10 MG PO TABS
ORAL_TABLET | ORAL | Status: AC
  Filled 2019-11-16: qty 1

## 2019-11-16 MED ORDER — PRASUGREL HCL 10 MG PO TABS
ORAL_TABLET | ORAL | Status: AC
  Filled 2019-11-16: qty 5

## 2019-11-16 MED ORDER — NITROGLYCERIN 1 MG/10 ML FOR IR/CATH LAB
INTRA_ARTERIAL | Status: DC | PRN
  Administered 2019-11-16 (×2): 200 ug via INTRACORONARY

## 2019-11-16 MED ORDER — IOHEXOL 350 MG/ML SOLN
INTRAVENOUS | Status: DC | PRN
  Administered 2019-11-16: 125 mL

## 2019-11-16 MED ORDER — SODIUM CHLORIDE 0.9% FLUSH: 3.0000 mL | INTRAVENOUS | Status: DC | PRN

## 2019-11-16 MED ORDER — HEPARIN SODIUM (PORCINE) 1000 UNIT/ML IJ SOLN
INTRAMUSCULAR | Status: DC | PRN
  Administered 2019-11-16: 4000 [IU] via INTRAVENOUS
  Administered 2019-11-16: 2000 [IU] via INTRAVENOUS
  Administered 2019-11-16: 4000 [IU] via INTRAVENOUS

## 2019-11-16 MED ORDER — PRASUGREL HCL 10 MG PO TABS
ORAL_TABLET | ORAL | Status: DC | PRN
  Administered 2019-11-16: 60 mg via ORAL

## 2019-11-16 MED ORDER — TIROFIBAN HCL IN NACL 5-0.9 MG/100ML-% IV SOLN
INTRAVENOUS | Status: AC
  Filled 2019-11-16: qty 100

## 2019-11-16 MED ORDER — MORPHINE SULFATE (PF) 2 MG/ML IV SOLN
1.0000 mg | INTRAVENOUS | Status: DC | PRN
  Administered 2019-11-16: 2 mg via INTRAVENOUS

## 2019-11-16 MED ORDER — TIROFIBAN HCL IN NACL 5-0.9 MG/100ML-% IV SOLN
INTRAVENOUS | Status: AC | PRN
  Administered 2019-11-16: 0.15 ug/kg/min via INTRAVENOUS

## 2019-11-16 MED ORDER — TIROFIBAN (AGGRASTAT) BOLUS VIA INFUSION
INTRAVENOUS | Status: DC | PRN
  Administered 2019-11-16: 2040 ug via INTRAVENOUS

## 2019-11-16 MED ORDER — IOHEXOL 350 MG/ML SOLN
INTRAVENOUS | Status: AC
  Filled 2019-11-16: qty 1

## 2019-11-16 MED ORDER — MIDAZOLAM HCL 2 MG/2ML IJ SOLN
INTRAMUSCULAR | Status: AC
  Filled 2019-11-16: qty 2

## 2019-11-16 MED ORDER — TIROFIBAN HCL IN NACL 5-0.9 MG/100ML-% IV SOLN
0.1500 ug/kg/min | INTRAVENOUS | Status: AC
  Administered 2019-11-16: 0.15 ug/kg/min via INTRAVENOUS
  Filled 2019-11-16: qty 100

## 2019-11-16 MED ORDER — ATORVASTATIN CALCIUM 80 MG PO TABS
80.0000 mg | ORAL_TABLET | Freq: Every day | ORAL | Status: DC
  Administered 2019-11-16 – 2019-11-17 (×2): 80 mg via ORAL
  Filled 2019-11-16 (×2): qty 1

## 2019-11-16 MED ORDER — MIDAZOLAM HCL 2 MG/2ML IJ SOLN
INTRAMUSCULAR | Status: DC | PRN
  Administered 2019-11-16 (×2): 1 mg via INTRAVENOUS

## 2019-11-16 MED ORDER — SODIUM CHLORIDE 0.9% FLUSH
3.0000 mL | Freq: Two times a day (BID) | INTRAVENOUS | Status: DC
  Administered 2019-11-16 – 2019-11-18 (×4): 3 mL via INTRAVENOUS

## 2019-11-16 MED ORDER — NITROGLYCERIN 1 MG/10 ML FOR IR/CATH LAB
INTRA_ARTERIAL | Status: AC
  Filled 2019-11-16: qty 10

## 2019-11-16 MED ORDER — HEPARIN (PORCINE) IN NACL 1000-0.9 UT/500ML-% IV SOLN
INTRAVENOUS | Status: DC | PRN
  Administered 2019-11-16 (×2): 500 mL

## 2019-11-16 MED ORDER — HEPARIN (PORCINE) IN NACL 1000-0.9 UT/500ML-% IV SOLN
INTRAVENOUS | Status: AC
  Filled 2019-11-16: qty 500

## 2019-11-16 MED ORDER — AMLODIPINE BESYLATE 5 MG PO TABS: 5.0000 mg | ORAL_TABLET | Freq: Every day | ORAL | Status: DC

## 2019-11-16 MED ORDER — ACETAMINOPHEN 325 MG PO TABS: 650.0000 mg | ORAL_TABLET | ORAL | Status: DC | PRN

## 2019-11-16 MED ORDER — FENTANYL CITRATE (PF) 100 MCG/2ML IJ SOLN
INTRAMUSCULAR | Status: AC
  Filled 2019-11-16: qty 2

## 2019-11-16 MED ORDER — VERAPAMIL HCL 2.5 MG/ML IV SOLN
INTRAVENOUS | Status: DC | PRN
  Administered 2019-11-16: 10 mL via INTRA_ARTERIAL

## 2019-11-16 MED ORDER — ATORVASTATIN CALCIUM 40 MG PO TABS: 40.0000 mg | ORAL_TABLET | Freq: Every day | ORAL | Status: DC

## 2019-11-16 MED ORDER — LIDOCAINE HCL (PF) 1 % IJ SOLN
INTRAMUSCULAR | Status: AC
  Filled 2019-11-16: qty 30

## 2019-11-16 MED ORDER — HEPARIN (PORCINE) IN NACL 1000-0.9 UT/500ML-% IV SOLN
INTRAVENOUS | Status: AC
  Filled 2019-11-16: qty 1000

## 2019-11-16 MED ORDER — SODIUM CHLORIDE 0.9 % IV BOLUS
250.0000 mL | Freq: Once | INTRAVENOUS | Status: AC
  Administered 2019-11-16: 250 mL via INTRAVENOUS

## 2019-11-16 MED ORDER — MORPHINE SULFATE (PF) 2 MG/ML IV SOLN
INTRAVENOUS | Status: AC
  Filled 2019-11-16: qty 1

## 2019-11-16 MED ORDER — INSULIN ASPART 100 UNIT/ML ~~LOC~~ SOLN
0.0000 [IU] | Freq: Every day | SUBCUTANEOUS | Status: DC
  Administered 2019-11-16: 3 [IU] via SUBCUTANEOUS

## 2019-11-16 MED ORDER — HYDRALAZINE HCL 20 MG/ML IJ SOLN: 10.0000 mg | INTRAMUSCULAR | Status: AC | PRN

## 2019-11-16 MED ORDER — ONDANSETRON HCL 4 MG/2ML IJ SOLN
4.0000 mg | Freq: Four times a day (QID) | INTRAMUSCULAR | Status: DC | PRN
  Administered 2019-11-16: 4 mg via INTRAVENOUS
  Filled 2019-11-16: qty 2

## 2019-11-16 MED ORDER — INSULIN ASPART 100 UNIT/ML ~~LOC~~ SOLN
0.0000 [IU] | Freq: Three times a day (TID) | SUBCUTANEOUS | Status: DC
  Administered 2019-11-16 – 2019-11-17 (×2): 3 [IU] via SUBCUTANEOUS
  Administered 2019-11-17: 2 [IU] via SUBCUTANEOUS
  Administered 2019-11-17: 5 [IU] via SUBCUTANEOUS
  Administered 2019-11-18 (×2): 3 [IU] via SUBCUTANEOUS

## 2019-11-16 MED ORDER — PRASUGREL HCL 10 MG PO TABS
10.0000 mg | ORAL_TABLET | Freq: Every day | ORAL | Status: DC
  Administered 2019-11-17 – 2019-11-18 (×2): 10 mg via ORAL
  Filled 2019-11-16 (×2): qty 1

## 2019-11-16 MED ORDER — NITROGLYCERIN 0.4 MG SL SUBL: 0.4000 mg | SUBLINGUAL_TABLET | SUBLINGUAL | Status: DC | PRN

## 2019-11-16 SURGICAL SUPPLY — 24 items
BALLN EMERGE MR 2.0X12 (BALLOONS) ×2
BALLN EMERGE MR 2.5X12 (BALLOONS) ×2
BALLN SAPPHIRE ~~LOC~~ 2.75X18 (BALLOONS) ×2 IMPLANT
BALLOON EMERGE MR 2.0X12 (BALLOONS) ×1 IMPLANT
BALLOON EMERGE MR 2.5X12 (BALLOONS) ×1 IMPLANT
CATH INFINITI 5 FR JL3.5 (CATHETERS) ×2 IMPLANT
CATH INFINITI 5FR ANG PIGTAIL (CATHETERS) ×2 IMPLANT
CATH LAUNCHER 6FR JR4 (CATHETERS) ×2 IMPLANT
DEVICE RAD COMP TR BAND LRG (VASCULAR PRODUCTS) ×2 IMPLANT
ELECT DEFIB PAD ADLT CADENCE (PAD) ×2 IMPLANT
GLIDESHEATH SLEND SS 6F .021 (SHEATH) ×2 IMPLANT
GUIDEWIRE INQWIRE 1.5J.035X260 (WIRE) ×1 IMPLANT
HOVERMATT SINGLE USE (MISCELLANEOUS) ×2 IMPLANT
INQWIRE 1.5J .035X260CM (WIRE) ×2
KIT ENCORE 26 ADVANTAGE (KITS) ×2 IMPLANT
KIT HEART LEFT (KITS) ×2 IMPLANT
PACK CARDIAC CATHETERIZATION (CUSTOM PROCEDURE TRAY) ×2 IMPLANT
STENT SYNERGY XD 2.50X24 (Permanent Stent) ×1 IMPLANT
SYNERGY XD 2.50X24 (Permanent Stent) ×2 IMPLANT
SYR MEDRAD MARK 7 150ML (SYRINGE) ×2 IMPLANT
TRANSDUCER W/STOPCOCK (MISCELLANEOUS) ×2 IMPLANT
TUBING CIL FLEX 10 FLL-RA (TUBING) ×2 IMPLANT
WIRE ASAHI PROWATER 180CM (WIRE) ×2 IMPLANT
WIRE RUNTHROUGH .014X180CM (WIRE) ×2 IMPLANT

## 2019-11-16 NOTE — Progress Notes (Signed)
\  Echocardiogram 2D Echocardiogram has been performed.  Oneal Deputy Pharaoh Pio 11/16/2019, 3:02 PM

## 2019-11-16 NOTE — Progress Notes (Signed)
Pt c/o chest pressure, substernal, 5/10.  Spoke to Dr End regarding pain, orders obtained for Morphine 2mg  prn.  Given with some resolution of discomfort.  Dr End came by room shortly after this and gave verbal orders for a 250 NS bolus due to softer pressures.  Carried out orders.  Educated pt on cath results and plan.    Karsten Ro, RN

## 2019-11-16 NOTE — Progress Notes (Signed)
ANTICOAGULATION CONSULT NOTE - Initial Consult  Pharmacy Consult for Aggrastat (tirofiban) Indication: Post-PCI  No Known Allergies  Patient Measurements:    Vital Signs:    Labs: Recent Labs    11/16/19 1102  HGB 13.6  HCT 38.8  PLT 208  APTT 21*  LABPROT 12.8  INR 1.0  CREATININE 0.79  TROPONINIHS 95*    Estimated Creatinine Clearance: 79.5 mL/min (by C-G formula based on SCr of 0.79 mg/dL).   Medical History: Past Medical History:  Diagnosis Date  . Asthma    childhood  . BRCA1 positive   . Bronchitis   . Diabetes mellitus without complication ([place])   . Eczema   . Family history of breast cancer   . Family history of ovarian cancer   . High cholesterol   . Hypertension   . Pneumonia     Assessment: 64 yo F presents with inferior STEMI. No AC PTA. Patient was given Aggrastat 0.25 mg/kg bolus IV in cath and started on Aggrastat 0.15 mg/kg/min infusion at 1130. CBC is wnl. No overt bleeding noted. Renal function stable (Scr 0.79), most recent weight 81.6 kg.  Goal of Therapy:  Monitor platelets by anticoagulation protocol: Yes   Plan:  Continue Aggrastat (tirofiban) 0.15 mg/kg/min infusion until 1530 on 7/12 Monitor for bleeding  Richardine Service, PharmD PGY2 Cardiology Pharmacy Resident Phone: [phone] 11/16/2019  1:24 PM  Please check [email or web address] for unit-specific pharmacy phone numbers.

## 2019-11-16 NOTE — Progress Notes (Signed)
[facility] Post-PCI Note  Date: 11/16/19  Time: 6:22 PM  Subjective:  Patient has continued to have intermittent chest this afternoon.  She describes both pressure-like discomfort and a sharp pain most noticeable with deep inspiration.  She was also nauseated this afternoon.  Overall, she does feel better compared with before PCI this AM.  Objective: Pulse Rate:  [63] 63 (07/12 1500) Resp:  [19] 19 (07/12 1500) BP: (92)/(52) 92/52 (07/12 1500) SpO2:  [95 %-100 %] 95 % (07/12 1500) Gen: NAD. Lungs: CTA bilaterally. Heart: RRR w/o murmurs. Ext: Right radial with TR band in place; no bleeding.  EKG (today at 16:10): NSR with poor R wave progression and nonspecific ST changes; inferior ST elevation has resolved.  Echo: LVEF 55-60% with mild LVH and small pericardial effusion.  Assessment/plan: 64 y/o woman with inferior STEMI s/p primary PCI to occluded distal RCA this morning.  She continues to have intermittent chest pain as well as hypotension.  Most recent EKG at 16:10 (patient was chest pain free at that time), showed resolution of inferior ST elevation.  Bedside echo personally performed this evening shows normal LVEF without significant pericardial effusion (trivial/small effusion cannot be excluded).  I suspect ongoing chest pain may be a manifestation of small vessel occlusion and pericarditis, given some degree of pleuritic component.  No evidence of enlarging pericardial effusion.  Hypotension may be reflective of relative intravascular volume depletion and possible RV involvement (though RCA occlusion was quite distal, beyond RV marginal branches).  We will bolus NS 500 mL x 1 and add colchicine 0.6 mg BID.  I will repeat an EKG to ensure that there are no dynamic changes to suggest further ischemia.  If patient has refractory angina, she may ultimately require relook cath and possible intervention to LAD, though this did not appear critical on today's cath.  Nelva Bush,  MD [facility]

## 2019-11-16 NOTE — H&P (Addendum)
Cardiology Admission History and Physical:   Patient ID: Sarah Ali MRN: [id number]; DOB: 08/11/1955   Admission date: 11/16/2019  Primary Care Provider: Merrilee Seashore, MD [facility] Cardiologist: [facility] (Dr. Sheila Oats) [place] Electrophysiologist:  None   Chief Complaint:  Chest Pain/STEMI  Patient Profile:   Sarah Ali is a 64 y.o. female with a history of CAD s/p DES to CAD in 2015, hypertension, hyperlipidemia, and type 2 diabetes mellitus who presented with inferior STEMI.  History of Present Illness:   Sarah Ali is a 64 year old female with a history of  history of CAD s/p DES to CAD in 2015, hypertension, hyperlipidemia, and type 2 diabetes mellitus. She is followed by Dr. Sheila Oats at [facility].  She was seen in the [facility] ED on 11/11/2019 for intermittent chest pressure. No associated symptoms at the time. EKG showed no acute ischemic changes. High-sensitivity troponin negative x3. D-dimer negative. Patient discharged with instructions to follow-up with outpatient Cardiologist.   Patient continued to have off and on substernal chest pressure since then that significantly worsened today. Currently ranks pain 10/10. Radiates to right elbow. She notes associated nausea and vomited earlier today. No associated shortness of breath. She called 911 and was noted to have an inferior STEMI. She received Aspirin 336m, IV fluids, and Zofran en route to the ED.   Upon arrival to ED, patient stopped briefly for COVID swab and then taken immediately to cath lab for emergent cardiac catheterization.   Past Medical History:  Diagnosis Date  . Asthma    childhood  . BRCA1 positive   . Bronchitis   . Diabetes mellitus without complication ([place]   . Eczema   . Family history of breast cancer   . Family history of ovarian cancer   . High cholesterol   . Hypertension   . Pneumonia     Past Surgical History:  Procedure Laterality Date  . BREAST BIOPSY    . BREAST SURGERY   07/05/2016   biopsy  . COLONOSCOPY    . CORONARY ANGIOPLASTY  2015  . DILATATION & CURRETTAGE/HYSTEROSCOPY WITH RESECTOCOPE N/A 07/19/2016   Procedure: DILATATION & CURETTAGE/HYSTEROSCOPY WITH RESECTION OF ENDOMETRIAL POLYP WITH MYOSURE;  Surgeon: SServando Salina MD;  [facility];  Service: Gynecology;  Laterality: N/A;  . ROBOTIC ASSISTED BILATERAL SALPINGO OOPHERECTOMY Bilateral 07/19/2016   Procedure: ROBOTIC ASSISTED BILATERAL SALPINGO OOPHORECTOMY AND LYSIS OF ADHESIONS, PELVIC WASHINGS;  Surgeon: SServando Salina MD;  [facility];  Service: Gynecology;  Laterality: Bilateral;  DO NOT DRAPE THE ROBOT      Medications Prior to Admission: Prior to Admission medications   Medication Sig Start Date Veda Arrellano Date Taking? Authorizing Provider  amLODipine-olmesartan (AZOR) 5-40 MG per tablet Take 1 tablet by mouth daily.    [provider]  aspirin 81 MG tablet Take 81 mg by mouth daily.    [provider]  atorvastatin (LIPITOR) 40 MG tablet Take 40 mg by mouth daily at 6 PM.    [provider]  Cholecalciferol (VITAMIN D) 2000 units tablet Take 2,000 Units by mouth daily.    [provider]  empagliflozin (JARDIANCE) 25 MG TABS tablet Take 25 mg by mouth daily.    [provider]  fexofenadine (ALLEGRA) 60 MG tablet Take 60 mg by mouth daily as needed for allergies or rhinitis.    [provider]  hydrocortisone cream 1 % Apply 1 application topically 2 (two) times daily as needed for itching.    [provider]  ibuprofen (ADVIL,MOTRIN) 800 MG tablet Take 1 tablet (800 mg total) by mouth every 8 (eight) hours as needed. Patient not taking: Reported on 11/12/2019 07/19/16   Servando Salina, MD  JANUVIA 100 MG tablet Take 100 mg by mouth daily. 09/21/19   [provider]  metFORMIN (GLUMETZA) 500 MG (MOD) 24 hr tablet Take 1,000 mg by mouth 2 (two) times daily with a meal.     [provider]    methylPREDNISolone (MEDROL DOSEPAK) 4 MG TBPK tablet Take 4-24 mg by mouth See admin instructions. Take 6 tablets on day 1 then decrease by 1 tablet daily until all taken 11/09/19   [provider]  Multiple Vitamins-Minerals (MULTIVITAMIN WITH MINERALS) tablet Take 1 tablet by mouth daily.    [provider]  nitroGLYCERIN (NITROSTAT) 0.4 MG SL tablet Place 0.4 mg under the tongue every 5 (five) minutes as needed for chest pain.    [provider]  tobramycin (TOBREX) 0.3 % ophthalmic solution Place 2 drops into the left eye 4 (four) times daily. Started 11/09/19 11/09/19   [provider]  vitamin E 400 UNIT capsule Take 400 Units by mouth daily.    [provider]     Allergies:   No Known Allergies  Social History:   Social History   Socioeconomic History  . Marital status: Widowed    Spouse name: Not on file  . Number of children: 3  . Years of education: Not on file  . Highest education level: Not on file  Occupational History  . Not on file  Tobacco Use  . Smoking status: Never Smoker  . Smokeless tobacco: Never Used  Substance and Sexual Activity  . Alcohol use: Yes    Comment: occ  . Drug use: No  . Sexual activity: Not on file  Other Topics Concern  . Not on file  Social History Narrative  . Not on file   Social Determinants of Health   Financial Resource Strain:   . Difficulty of Paying Living Expenses:   Food Insecurity:   . Worried About Charity fundraiser in the Last Year:   . Arboriculturist in the Last Year:   Transportation Needs:   . Film/video editor (Medical):   Marland Kitchen Lack of Transportation (Non-Medical):   Physical Activity:   . Days of Exercise per Week:   . Minutes of Exercise per Session:   Stress:   . Feeling of Stress :   Social Connections:   . Frequency of Communication with Friends and Family:   . Frequency of Social Gatherings with Friends and Family:   . Attends Religious Services:   . Active  Member of Clubs or Organizations:   . Attends Archivist Meetings:   Marland Kitchen Marital Status:   Intimate Partner Violence:   . Fear of Current or Ex-Partner:   . Emotionally Abused:   Marland Kitchen Physically Abused:   . Sexually Abused:     Family History:   The patient's family history includes BRCA 1/2 in her brother and maternal aunt; Breast cancer in her maternal aunt and maternal grandmother; Colon cancer in her paternal aunt; Colon cancer (age of onset: 31) in her mother; Ovarian cancer (age of onset: 49) in her mother; Parkinson's disease (age of onset: 79) in her maternal grandfather; Skin cancer in her father.    ROS:  Please see the history of present illness.  Unable to obtain full ROS due to need for  emergent cardiac catheterization.  Physical Exam/Data:  There were no vitals filed for this visit. No intake or output data in the 24 hours ending 11/16/19 1102 Last 3 Weights 11/11/2019 11/11/2019 07/11/2016  Weight (lbs) 180 lb 182 lb 15.7 oz 182 lb 2 oz  Weight (kg) 81.647 kg 83 kg 82.611 kg     There is no height or weight on file to calculate BMI.   Physical Exam per MD:  General: 64 y.o. female resting comfortably in no acute distress. HEENT: Normocephalic and atraumatic.  Neck: Supple.No JVD. Heart: RRR. Distinct S1 and S2. No murmurs, gallops, or rubs. Radial and distal pedal pulses 2+ and equal bilaterally. Lungs: No increased work of breathing. Clear to ausculation bilaterally. No wheezes, rhonchi, or rales.  Abdomen: Soft, non-distended, and non-tender to palpation.  MSK: Normal strength and tone for age. Extremities: No lower extremity edema.    Skin: Warm and dry. Neuro: Alert and oriented x3. No focal deficits. Psych: Normal affect. Responds appropriately.  EKG:  The ECG that was done was personally reviewed and demonstrates sinus bradycardia, rate 55 bpm, with ST elevation in inferior leads consistent with STEMI  Relevant CV Studies:   Laboratory Data:  High  Sensitivity Troponin:   Recent Labs  Lab 11/11/19 1623 11/11/19 2110 11/12/19 0503  TROPONINIHS '9 9 7      ' Chemistry Recent Labs  Lab 11/11/19 1623  NA 140  K 3.9  CL 103  CO2 26  GLUCOSE 178*  BUN 26*  CREATININE 0.89  CALCIUM 9.2  GFRNONAA >60  GFRAA >60  ANIONGAP 11    No results for input(s): PROT, ALBUMIN, AST, ALT, ALKPHOS, BILITOT in the last 168 hours. Hematology Recent Labs  Lab 11/11/19 1623  WBC 10.4  RBC 4.47  HGB 13.3  HCT 39.2  MCV 87.7  MCH 29.8  MCHC 33.9  RDW 12.3  PLT 247   BNPNo results for input(s): BNP, PROBNP in the last 168 hours.  DDimer  Recent Labs  Lab 11/12/19 0503  DDIMER 0.34     Radiology/Studies:  No results found.  TIMI Risk Score for ST  Elevation MI:   The patient's TIMI risk score is 1, which indicates a 1.6% risk of all cause mortality at 30 days.       Assessment and Plan:   Acute Inferior STEMI - History of DES to RCA in 2015. [facility]. - Patient seen in ED on 11/11/2019 for intermittent chest pressure. High-sensitivity troponin negative x3 and EKG non-acute so patient discharged. Presents today with inferior STEMI. - Trend troponin. - Check hemoglobin A1c and lipid panel. - Check Echo. - Patient taken to cath lab for emergent cardiac cath. Further recommendations to follow.  - Continue Aspirin and high-intensity statin. Baseline bradycardia so will hold on beta-blocker.  Hypertension - BP soft but stable on arrival. - On Amlodipine-Olmesartan 5-64m daily at home. Will hold for now and can adjust medications following cath.  Hyperlipidemia - Will check lipid panel. - On Lipitor 415mdaily at home. Will go ahead and increase to 8069maily.  Type 2 Diabetes Mellitus - Will check hemoglobin A1c. - On Jardiance and Metformin at home.  - Will hold home medications for now and start sliding scale.  Severity of Illness: The appropriate patient status for this patient is INPATIENT. Inpatient status  is judged to be reasonable and necessary in order to provide the required intensity of service to ensure the patient's safety. The patient's presenting symptoms, physical  exam findings, and initial radiographic and laboratory data in the context of their chronic comorbidities is felt to place them at high risk for further clinical deterioration. Furthermore, it is not anticipated that the patient will be medically stable for discharge from the hospital within 2 midnights of admission. The following factors support the patient status of inpatient.   " The patient's presenting symptoms include chest pain with associated nausea. " The worrisome physical exam findings as above " The initial radiographic and laboratory data are worrisome because of inferior STEMI on EKG. " The chronic co-morbidities include HTN, HLD, DM.   * I certify that at the point of admission it is my clinical judgment that the patient will require inpatient hospital care spanning beyond 2 midnights from the point of admission due to high intensity of service, high risk for further deterioration and high frequency of surveillance required.*    For questions or updates, please contact [place] Please consult [email or web address] for contact info under    Signed, Darreld Mclean, PA-C  11/16/2019 11:02 AM   I have independently seen and examined the patient and agree with the findings and plan, as documented in the PA/NP's note, with the following additions/changes.  Patient is 64 year old woman with history of CAD status post remote PCI to the RCA at [facility], hypertension, hyperlipidemia, and type 2 diabetes mellitus, presenting via EMS for evaluation of chest pain and abnormal EKG.  She reports a 1 week history of off-and-on chest pain that led to ED evaluation 5 days ago.  EKG showed nonspecific findings at that time.  High-sensitivity troponin I was negative.  The patient was discharged home and advised to follow-up with her  outpatient cardiologist.  She is continued to have chest pain which acutely worsened this morning.  She complains of 10/10 substernal chest pain with radiation to the left elbow.  She also had some nausea.  She summoned EMS and was found to have inferior ST segment elevation.  She received aspirin and ondansetron.  She underwent emergent left heart catheterization, which showed occlusion of the distal RCA within previously stented segment as well as de novo disease leading up to the bifurcation.  She underwent successful drug-eluting stent placement to the distal RCA as well as balloon angioplasty of the ostial PDA.  She is now chest pain-free.  Physical exam is notable for regular rate and rhythm without murmurs.  Lungs are clear.  Abdomen is soft and nontender.  Radial pulses are 2+.  Immediately post PCI EKG shows sinus rhythm with some persistent inferior ST elevation.  Initial high-sensitivity troponin I is 95.  In summary, Sarah Ali is a 64 year old woman admitted with inferior STEMI from very late stent thrombosis of the distal RCA stent as well as high-grade disease involving the unstented distal RCA extending to the bifurcation.  As she is now status post primary PCI, we will monitor her at least one night in the ICU before transfer to the floor.  We will complete 4 hours of tirofiban and keep her on dual antiplatelet therapy with aspirin and prasugrel for at least 12 months.  Given very late stent thrombosis and overlapping stents of the distal RCA, long-term DAPT should be considered.  We will obtain a transthoracic echocardiogram, with left ventriculogram showing low normal LVEF with basal and mid inferior hypokinesis.  I will escalate atorvastatin to 80 mg daily, given LDL of 85 today.  We will continue home doses of amlodipine and olmesartan.  We will monitor  heart rate to determine if addition of beta-blockers appropriate, as borderline bradycardia was noted during the procedure.  Nelva Bush, MD [facility]

## 2019-11-17 DIAGNOSIS — I2119 ST elevation (STEMI) myocardial infarction involving other coronary artery of inferior wall: Secondary | ICD-10-CM

## 2019-11-17 LAB — BASIC METABOLIC PANEL
Anion gap: 10 (ref 5–15)
BUN: 16 mg/dL (ref 8–23)
CO2: 24 mmol/L (ref 22–32)
Calcium: 8.7 mg/dL — ABNORMAL LOW (ref 8.9–10.3)
Chloride: 106 mmol/L (ref 98–111)
Creatinine, Ser: 0.9 mg/dL (ref 0.44–1.00)
GFR calc Af Amer: >60 mL/min (ref >60)
GFR calc non Af Amer: >60 mL/min (ref >60)
Glucose, Bld: 195 mg/dL — ABNORMAL HIGH (ref 70–99)
Potassium: 3.8 mmol/L (ref 3.5–5.1)
Sodium: 140 mmol/L (ref 135–145)

## 2019-11-17 LAB — CBC
HCT: 35.2 % — ABNORMAL LOW (ref 36.0–46.0)
Hemoglobin: 12.1 g/dL (ref 12.0–15.0)
MCH: 30.7 pg (ref 26.0–34.0)
MCHC: 34.4 g/dL (ref 30.0–36.0)
MCV: 89.3 fL (ref 80.0–100.0)
Platelets: 200 10*3/uL (ref 150–400)
RBC: 3.94 MIL/uL (ref 3.87–5.11)
RDW: 12.6 % (ref 11.5–15.5)
WBC: 7.4 10*3/uL (ref 4.0–10.5)
nRBC: 0 % (ref 0.0–0.2)

## 2019-11-17 LAB — GLUCOSE, CAPILLARY
Glucose-Capillary: 135 mg/dL — ABNORMAL HIGH (ref 70–99)
Glucose-Capillary: 223 mg/dL — ABNORMAL HIGH (ref 70–99)
Glucose-Capillary: 157 mg/dL — ABNORMAL HIGH (ref 70–99)
Glucose-Capillary: 145 mg/dL — ABNORMAL HIGH (ref 70–99)
Glucose-Capillary: 146 mg/dL — ABNORMAL HIGH (ref 70–99)

## 2019-11-17 LAB — TROPONIN I (HIGH SENSITIVITY)
Troponin I (High Sensitivity): 11275 ng/L (ref <18)
Troponin I (High Sensitivity): 8576 ng/L (ref <18)

## 2019-11-17 LAB — MRSA PCR SCREENING: MRSA by PCR: NEGATIVE

## 2019-11-17 MED ORDER — CHLORHEXIDINE GLUCONATE CLOTH 2 % EX PADS
6.0000 | MEDICATED_PAD | Freq: Every day | CUTANEOUS | Status: DC
  Administered 2019-11-17: 6 via TOPICAL

## 2019-11-17 MED ORDER — COLCHICINE 0.6 MG PO TABS
0.6000 mg | ORAL_TABLET | Freq: Every day | ORAL | Status: DC
  Administered 2019-11-17: 0.6 mg via ORAL
  Filled 2019-11-17: qty 1

## 2019-11-17 MED ORDER — LINAGLIPTIN 5 MG PO TABS
5.0000 mg | ORAL_TABLET | Freq: Every day | ORAL | Status: DC
  Administered 2019-11-17 – 2019-11-18 (×2): 5 mg via ORAL
  Filled 2019-11-17 (×2): qty 1

## 2019-11-17 NOTE — Progress Notes (Signed)
Inpatient Diabetes Program Recommendations  AACE/ADA: New Consensus Statement on Inpatient Glycemic Control (2015)  Target Ranges:  Prepandial:   less than 140 mg/dL      Peak postprandial:   less than 180 mg/dL (1-2 hours)      Critically ill patients:  140 - 180 mg/dL   Lab Results  Component Value Date   GLUCAP 223 (H) 11/17/2019   HGBA1C 6.8 (H) 11/16/2019    Review of Glycemic Control  Diabetes history: DM2 Outpatient Diabetes medications: Jardiance 25 mg QD, Januvia 100 mg QD, Glumetza 500 mg QD Current orders for Inpatient glycemic control: Novolog 0-15 units tidwc and hs, tradjenta 5 mg QD  HgbA1C - 6.8% - good glycemic control 135, 223 mg/dL this am.  Inpatient Diabetes Program Recommendations:     If FBS > 180 mg/dL, add Novolog 3 units tidwc  Thank you. Lorenda Peck, RD, LDN, CDE Inpatient Diabetes Coordinator [phone]

## 2019-11-17 NOTE — Progress Notes (Signed)
Progress Note  Patient Name: Sarah Ali Date of Encounter: 11/17/2019  [facility] Cardiologist: No primary care provider on file. [facility]  Subjective   Only having mild chest pain with inspiration. No dyspnea  Inpatient Medications    Scheduled Meds: . aspirin EC  81 mg Oral Daily  . atorvastatin  80 mg Oral q1800  . Chlorhexidine Gluconate Cloth  6 each Topical Daily  . colchicine  0.6 mg Oral BID  . empagliflozin  25 mg Oral Daily  . enoxaparin (LOVENOX) injection  40 mg Subcutaneous Q24H  . insulin aspart  0-15 Units Subcutaneous TID WC  . insulin aspart  0-5 Units Subcutaneous QHS  . prasugrel  10 mg Oral Daily  . sodium chloride flush  3 mL Intravenous Q12H   Continuous Infusions: . sodium chloride     PRN Meds: sodium chloride, acetaminophen, morphine injection, nitroGLYCERIN, ondansetron (ZOFRAN) IV, sodium chloride flush   Vital Signs    Vitals:   11/17/19 0500 11/17/19 0600 11/17/19 0700 11/17/19 0743  BP: (!) 94/51 (!) 96/52 100/63   Pulse: 64 66 73   Resp: 13 13 17    Temp:    98.9 F (37.2 C)  TempSrc:    Oral  SpO2: 96% 97% 98%     Intake/Output Summary (Last 24 hours) at 11/17/2019 0849 Last data filed at 11/16/2019 1830 Gross per 24 hour  Intake 1129.1 ml  Output 2 ml  Net 1127.1 ml   Last 3 Weights 11/11/2019 11/11/2019 07/11/2016  Weight (lbs) 180 lb 182 lb 15.7 oz 182 lb 2 oz  Weight (kg) 81.647 kg 83 kg 82.611 kg      Telemetry    Sinus - Personally Reviewed  ECG    Sinus, inferior Q waves, inferior ST elevation has resolved - Personally Reviewed  Physical Exam   GEN: No acute distress.   Neck: No JVD Cardiac: RRR, no murmurs, rubs, or gallops.  Respiratory: Clear to auscultation bilaterally. GI: Soft, nontender, non-distended  MS: No edema; No deformity. Neuro:  Nonfocal  Psych: Normal affect   Labs    High Sensitivity Troponin:   Recent Labs  Lab 11/11/19 2110 11/12/19 0503 11/16/19 1102 11/16/19 1517  11/17/19 0229  TROPONINIHS 9 7 95* 11,625* 11,275*      Chemistry Recent Labs  Lab 11/11/19 1623 11/16/19 1102 11/17/19 0229  NA 140 139 140  K 3.9 3.5 3.8  CL 103 105 106  CO2 26 19* 24  GLUCOSE 178* 265* 195*  BUN 26* 15 16  CREATININE 0.89 0.79 0.90  CALCIUM 9.2 8.7* 8.7*  PROT  --  6.0*  --   ALBUMIN  --  3.4*  --   AST  --  34  --   ALT  --  64*  --   ALKPHOS  --  111  --   BILITOT  --  0.8  --   GFRNONAA >60 >60 >60  GFRAA >60 >60 >60  ANIONGAP 11 15 10      Hematology Recent Labs  Lab 11/11/19 1623 11/16/19 1102 11/17/19 0229  WBC 10.4 6.5 7.4  RBC 4.47 4.55 3.94  HGB 13.3 13.6 12.1  HCT 39.2 38.8 35.2*  MCV 87.7 85.3 89.3  MCH 29.8 29.9 30.7  MCHC 33.9 35.1 34.4  RDW 12.3 12.2 12.6  PLT 247 208 200    BNPNo results for input(s): BNP, PROBNP in the last 168 hours.   DDimer  Recent Labs  Lab 11/12/19 0503  DDIMER 0.34  Radiology    CARDIAC CATHETERIZATION  Result Date: 11/16/2019 Conclusions: 1. Severe single-vessel coronary artery disease with thrombotic occlusion of the distal RCA involving the distal segment of old stent as well as unstented vessel beyond the stent.  There is also 80% stenosis involving the ostium of the RPDA. 2. Moderate, noncritical disease involving the mid LAD, mid LCx, and proximal RCA of up to 50-60%. 3. Low normal left ventricular contraction with basal and mid inferior hypokinesis.  LVEF 50-55%. 4. Mildly elevated left ventricular filling pressure. 5. Successful PCI to distal RCA using a Synergy 2.5 x 24 mm drug-eluting stent extending from the distal half of the old stent to just before the bifurcation (postdilated to 2.9 mm) with 0% residual stenosis and TIMI-3 flow. 6. Successful PTCA to the ostium of RCA using Emerge 2.0 x 12 mm balloon with reduction of stenosis from 80% to 20%. Recommendations: 1. Admit to cardiac ICU for post STEMI monitoring. 2. Continue tirofiban infusion for 4 hours. 3. Dual antiplatelet therapy  with aspirin and prasugrel for at least 12 months.  Given overlapping stents in the distal RCA and very late stent thrombosis, consider long-term DAPT. 4. Medical therapy of noncritical disease involving the LAD, LCx, and proximal RCA.  Functional study to assess hemodynamic significance of LAD disease should be considered when patient has recovered from acute MI. 5. Aggressive secondary prevention. Nelva Bush, MD [facility]   ECHOCARDIOGRAM COMPLETE  Result Date: 11/16/2019    ECHOCARDIOGRAM REPORT   Patient Name:   Sarah Ali Date of Exam: 11/16/2019 Medical Rec #:  [id number]    Height:       66.0 in Accession #:    [id number]   Weight:       180.0 lb Date of Birth:  [birth year]     BSA:          1.912 m Patient Age:    64 years     BP:           95/59 mmHg Patient Gender: F            HR:           64 bpm. Exam Location:  Inpatient Procedure: 2D Echo, Color Doppler and Cardiac Doppler Indications:    STEMI  History:        Patient has prior history of Echocardiogram examinations, most                 recent 11/26/2013. STEMI; Risk Factors:Hypertension, Diabetes and                 Dyslipidemia. Prior performed at [facility].  Sonographer:    Raquel Sarna Senior RDCS Referring Phys: [place]  1. Normal LV function; grade 1 diastolic dysfunction; mild LVH; small pericardial effusion.  2. Left ventricular ejection fraction, by estimation, is 55 to 60%. The left ventricle has normal function. The left ventricle has no regional wall motion abnormalities. There is mild left ventricular hypertrophy. Left ventricular diastolic parameters are consistent with Grade I diastolic dysfunction (impaired relaxation).  3. Right ventricular systolic function is normal. The right ventricular size is normal.  4. The mitral valve is normal in structure. No evidence of mitral valve regurgitation. No evidence of mitral stenosis.  5. The aortic valve is tricuspid. Aortic valve regurgitation is not visualized. No  aortic stenosis is present.  6. The inferior vena cava is normal in size with greater than 50% respiratory variability, suggesting right atrial pressure of 3  mmHg. FINDINGS  Left Ventricle: Left ventricular ejection fraction, by estimation, is 55 to 60%. The left ventricle has normal function. The left ventricle has no regional wall motion abnormalities. The left ventricular internal cavity size was normal in size. There is  mild left ventricular hypertrophy. Left ventricular diastolic parameters are consistent with Grade I diastolic dysfunction (impaired relaxation). Right Ventricle: The right ventricular size is normal.Right ventricular systolic function is normal. Left Atrium: Left atrial size was normal in size. Right Atrium: Right atrial size was normal in size. Pericardium: A small pericardial effusion is present. Mitral Valve: The mitral valve is normal in structure. Normal mobility of the mitral valve leaflets. No evidence of mitral valve regurgitation. No evidence of mitral valve stenosis. Tricuspid Valve: The tricuspid valve is normal in structure. Tricuspid valve regurgitation is trivial. No evidence of tricuspid stenosis. Aortic Valve: The aortic valve is tricuspid. Aortic valve regurgitation is not visualized. No aortic stenosis is present. Pulmonic Valve: The pulmonic valve was normal in structure. Pulmonic valve regurgitation is not visualized. No evidence of pulmonic stenosis. Aorta: The aortic root is normal in size and structure. Venous: The inferior vena cava is normal in size with greater than 50% respiratory variability, suggesting right atrial pressure of 3 mmHg. IAS/Shunts: No atrial level shunt detected by color flow Doppler. Additional Comments: Normal LV function; grade 1 diastolic dysfunction; mild LVH; small pericardial effusion.  LEFT VENTRICLE PLAX 2D LVIDd:         4.23 cm  Diastology LVIDs:         2.58 cm  LV e' lateral:   7.29 cm/s LV PW:         1.24 cm  LV E/e' lateral: 7.7 LV  IVS:        1.14 cm  LV e' medial:    4.79 cm/s LVOT diam:     1.80 cm  LV E/e' medial:  11.8 LV SV:         50 LV SV Index:   26 LVOT Area:     2.54 cm  RIGHT VENTRICLE RV S prime:     18.30 cm/s TAPSE (M-mode): 2.3 cm LEFT ATRIUM             Index       RIGHT ATRIUM           Index LA diam:        3.20 cm 1.67 cm/m  RA Area:     12.20 cm LA Vol (A2C):   36.4 ml 19.04 ml/m RA Volume:   27.90 ml  14.59 ml/m LA Vol (A4C):   42.6 ml 22.28 ml/m LA Biplane Vol: 40.4 ml 21.13 ml/m  AORTIC VALVE LVOT Vmax:   85.70 cm/s LVOT Vmean:  57.400 cm/s LVOT VTI:    0.195 m  AORTA Ao Root diam: 2.80 cm Ao Asc diam:  2.80 cm MITRAL VALVE MV Area (PHT): 3.60 cm    SHUNTS MV Decel Time: 211 msec    Systemic VTI:  0.20 m MV E velocity: 56.30 cm/s  Systemic Diam: 1.80 cm MV A velocity: 72.30 cm/s MV E/A ratio:  0.78 Kirk Ruths MD Electronically signed by Kirk Ruths MD Signature Date/Time: 11/16/2019/4:25:04 PM    Final     Cardiac Studies   Echo 11/16/19:  1. Normal LV function; grade 1 diastolic dysfunction; mild LVH; small  pericardial effusion.  2. Left ventricular ejection fraction, by estimation, is 55 to 60%. The  left ventricle has normal function. The left  ventricle has no regional  wall motion abnormalities. There is mild left ventricular hypertrophy.  Left ventricular diastolic parameters  are consistent with Grade I diastolic dysfunction (impaired relaxation).  3. Right ventricular systolic function is normal. The right ventricular  size is normal.  4. The mitral valve is normal in structure. No evidence of mitral valve  regurgitation. No evidence of mitral stenosis.  5. The aortic valve is tricuspid. Aortic valve regurgitation is not  visualized. No aortic stenosis is present.  6. The inferior vena cava is normal in size with greater than 50%  respiratory variability, suggesting right atrial pressure of 3 mmHg.   Cardiac cath 11/16/19: Conclusions: 1. Severe single-vessel coronary  artery disease with thrombotic occlusion of the distal RCA involving the distal segment of old stent as well as unstented vessel beyond the stent.  There is also 80% stenosis involving the ostium of the RPDA. 2. Moderate, noncritical disease involving the mid LAD, mid LCx, and proximal RCA of up to 50-60%. 3. Low normal left ventricular contraction with basal and mid inferior hypokinesis.  LVEF 50-55%. 4. Mildly elevated left ventricular filling pressure. 5. Successful PCI to distal RCA using a Synergy 2.5 x 24 mm drug-eluting stent extending from the distal half of the old stent to just before the bifurcation (postdilated to 2.9 mm) with 0% residual stenosis and TIMI-3 flow. 6. Successful PTCA to the ostium of RCA using Emerge 2.0 x 12 mm balloon with reduction of stenosis from 80% to 20%.  Recommendations: 1. Admit to cardiac ICU for post STEMI monitoring. 2. Continue tirofiban infusion for 4 hours. 3. Dual antiplatelet therapy with aspirin and prasugrel for at least 12 months.  Given overlapping stents in the distal RCA and very late stent thrombosis, consider long-term DAPT. 4. Medical therapy of noncritical disease involving the LAD, LCx, and proximal RCA.  Functional study to assess hemodynamic significance of LAD disease should be considered when patient has recovered from acute MI. 5. Aggressive secondary prevention.   Patient Profile     64 y.o. female with history of CAD, HTN, HLD, DM who was admitted 11/16/19 with an acute inferior STEMI secondary to occlusion of the distal RCA. The distal RCA was treated with a drug eluting stent overlapping with an old stent. The ostium of the right PDA was treated with balloon angioplasty.    Assessment & Plan    1. CAD/Acute inferior STEMI: She is doing well this am. Only mild chest pain with inspiration. BP is soft but stable. Will plan to continue DAPT with ASA and Effient for at least one year. Continue high intensity statin. LVEF preserved on  echo. I have reviewed her cath films and agree that her LAD disease is non-obstructive. Will transfer to the telemetry unit today.   2. HTN: BP soft this am. Norvasc/ARB on hold. No beta blocker yet with soft BP  3. HLD: continue statin  4. DM: Continue sliding scale insulin. Add back home oral therapy  For questions or updates, please contact [place] Please consult [email or web address] for contact info under        Signed, Lauree Chandler, MD  11/17/2019, 8:49 AM

## 2019-11-17 NOTE — Progress Notes (Signed)
CARDIAC REHAB PHASE I   PRE:  Rate/Rhythm: 72 SR    BP: sitting 101/57    SaO2: 99 RA  MODE:  Ambulation: 370 ft   POST:  Rate/Rhythm: 94 SR    BP: sitting 116/57     SaO2: 100 RA  Pt c/o "prickly" feeling in chest, esp with deep breath. Able to walk slowly. Discussed MI, stent, Effient, restrictions, diet, exercise, NTG, stress relief, and CRPII. Pt voiced understanding. She is understandably emotionally struggling due to MI events and today is her husbands birthday who passed last year. I encouraged CRPII and exercise. Not sure if she is seeing a counselor, which would be ideal. She is looking forward to CRPII in [place]. [phone]   Sarah Ali, ACSM 11/17/2019 12:04 PM

## 2019-11-17 NOTE — Progress Notes (Signed)
Pt arrived to rm 27 from [place]. Initiated pt on tele. CHG and assessment performed. Call bell within reach. VSS.  Lavenia Atlas, RN

## 2019-11-18 ENCOUNTER — Telehealth: Payer: Self-pay | Admitting: General Practice

## 2019-11-18 LAB — BASIC METABOLIC PANEL
Anion gap: 10 (ref 5–15)
BUN: 19 mg/dL (ref 8–23)
CO2: 24 mmol/L (ref 22–32)
Calcium: 8.3 mg/dL — ABNORMAL LOW (ref 8.9–10.3)
Chloride: 105 mmol/L (ref 98–111)
Creatinine, Ser: 0.93 mg/dL (ref 0.44–1.00)
GFR calc Af Amer: >60 mL/min (ref >60)
GFR calc non Af Amer: >60 mL/min (ref >60)
Glucose, Bld: 167 mg/dL — ABNORMAL HIGH (ref 70–99)
Potassium: 4.1 mmol/L (ref 3.5–5.1)
Sodium: 139 mmol/L (ref 135–145)

## 2019-11-18 LAB — CBC
HCT: 35 % — ABNORMAL LOW (ref 36.0–46.0)
Hemoglobin: 12 g/dL (ref 12.0–15.0)
MCH: 30.7 pg (ref 26.0–34.0)
MCHC: 34.3 g/dL (ref 30.0–36.0)
MCV: 89.5 fL (ref 80.0–100.0)
Platelets: 146 10*3/uL — ABNORMAL LOW (ref 150–400)
RBC: 3.91 MIL/uL (ref 3.87–5.11)
RDW: 12.6 % (ref 11.5–15.5)
WBC: 5 10*3/uL (ref 4.0–10.5)
nRBC: 0 % (ref 0.0–0.2)

## 2019-11-18 LAB — GLUCOSE, CAPILLARY
Glucose-Capillary: 154 mg/dL — ABNORMAL HIGH (ref 70–99)
Glucose-Capillary: 163 mg/dL — ABNORMAL HIGH (ref 70–99)

## 2019-11-18 MED ORDER — NITROGLYCERIN 0.4 MG SL SUBL: 0.4000 mg | SUBLINGUAL_TABLET | SUBLINGUAL | 12 refills | Status: DC | PRN

## 2019-11-18 MED ORDER — COLCHICINE 0.6 MG PO TABS: 0.6000 mg | ORAL_TABLET | Freq: Two times a day (BID) | ORAL | 1 refills | Status: DC

## 2019-11-18 MED ORDER — COLCHICINE 0.6 MG PO TABS: 0.6000 mg | ORAL_TABLET | Freq: Two times a day (BID) | ORAL | Status: DC

## 2019-11-18 MED ORDER — CARVEDILOL 3.125 MG PO TABS
3.1250 mg | ORAL_TABLET | Freq: Two times a day (BID) | ORAL | 11 refills | Status: DC
Start: 2019-11-18 — End: 2019-12-08

## 2019-11-18 MED ORDER — PRASUGREL HCL 10 MG PO TABS: 10.0000 mg | ORAL_TABLET | Freq: Every day | ORAL | 11 refills | Status: DC

## 2019-11-18 MED ORDER — ATORVASTATIN CALCIUM 80 MG PO TABS: 80.0000 mg | ORAL_TABLET | Freq: Every day | ORAL | 6 refills | Status: DC

## 2019-11-18 MED FILL — PRASUGREL HCL 10 MG TABS: 10 | 30 days supply | Qty: 30 | Fill #0

## 2019-11-18 MED FILL — CARVEDILOL 3.125 MG TABLET: 3.125 | 30 days supply | Qty: 60 | Fill #0

## 2019-11-18 MED FILL — NITROGLYCERIN 0.4 MG TAB SL: 0.4 | 8 days supply | Qty: 25 | Fill #0

## 2019-11-18 MED FILL — ATORVASTATIN CALCIUM 80 MG: 80 | 30 days supply | Qty: 30 | Fill #0

## 2019-11-18 MED FILL — COLCHICINE 0.6 MG TABS: 0.6 | 30 days supply | Qty: 60 | Fill #0

## 2019-11-18 NOTE — Plan of Care (Signed)
  Problem: Health Behavior/Discharge Planning: Goal: Ability to manage health-related needs will improve Outcome: Progressing   Problem: Clinical Measurements: Goal: Cardiovascular complication will be avoided Outcome: Progressing   

## 2019-11-18 NOTE — Discharge Summary (Addendum)
Discharge Summary    Patient ID: Sarah Ali MRN: [id number]; DOB: Jan 16, 1956  Admit date: 11/16/2019 Discharge date: 11/18/2019  Primary Care Provider: Merrilee Seashore, MD  Primary Cardiologist: Previously @ [facility], New to [facility] (like to [place] care with Dr. Ellyn Hack or Dr. Audie Box). Cant not do Dr. Saunders Revel due to location   Discharge Diagnoses    Principal Problem:   STEMI (ST elevation myocardial infarction) ([place]) Active Problems:   STEMI involving right coronary artery ([place])   Post infract pericarditis   HTN   HLD   DM   Diagnostic Studies/Procedures    Coronary/Graft Acute MI Revascularization  LEFT HEART CATH AND CORONARY ANGIOGRAPHY  Conclusion  Conclusions: 1. Severe single-vessel coronary artery disease with thrombotic occlusion of the distal RCA involving the distal segment of old stent as well as unstented vessel beyond the stent.  There is also 80% stenosis involving the ostium of the RPDA. 2. Moderate, noncritical disease involving the mid LAD, mid LCx, and proximal RCA of up to 50-60%. 3. Low normal left ventricular contraction with basal and mid inferior hypokinesis.  LVEF 50-55%. 4. Mildly elevated left ventricular filling pressure. 5. Successful PCI to distal RCA using a Synergy 2.5 x 24 mm drug-eluting stent extending from the distal half of the old stent to just before the bifurcation (postdilated to 2.9 mm) with 0% residual stenosis and TIMI-3 flow. 6. Successful PTCA to the ostium of RCA using Emerge 2.0 x 12 mm balloon with reduction of stenosis from 80% to 20%.  Recommendations: 1. Admit to cardiac ICU for post STEMI monitoring. 2. Continue tirofiban infusion for 4 hours. 3. Dual antiplatelet therapy with aspirin and prasugrel for at least 12 months.  Given overlapping stents in the distal RCA and very late stent thrombosis, consider long-term DAPT. 4. Medical therapy of noncritical disease involving the LAD, LCx, and proximal RCA.  Functional  study to assess hemodynamic significance of LAD disease should be considered when patient has recovered from acute MI. 5. Aggressive secondary prevention.  Nelva Bush, MD [facility]   Diagnostic Dominance: Right  Intervention    Echo 11/16/19 1. Normal LV function; grade 1 diastolic dysfunction; mild LVH; small  pericardial effusion.  2. Left ventricular ejection fraction, by estimation, is 55 to 60%. The  left ventricle has normal function. The left ventricle has no regional  wall motion abnormalities. There is mild left ventricular hypertrophy.  Left ventricular diastolic parameters  are consistent with Grade I diastolic dysfunction (impaired relaxation).  3. Right ventricular systolic function is normal. The right ventricular  size is normal.  4. The mitral valve is normal in structure. No evidence of mitral valve  regurgitation. No evidence of mitral stenosis.  5. The aortic valve is tricuspid. Aortic valve regurgitation is not  visualized. No aortic stenosis is present.  6. The inferior vena cava is normal in size with greater than 50%  respiratory variability, suggesting right atrial pressure of 3 mmHg.   History of Present Illness     Sarah Ali is a 64 y.o. female with a history of CAD s/p DES to RCA in 2015, hypertension, hyperlipidemia, and type 2 diabetes mellitus who presented with inferior STEMI.  She was previously followed at [facility]. Last seen 10/2018.  She was seen in the [facility] ED on 11/11/2019 for intermittent chest pressure. No associated symptoms at the time. EKG showed no acute ischemic changes. High-sensitivity troponin negative x3. D-dimer negative. Patient discharged with instructions to follow-up with outpatient Cardiologist.  Patient continued to have off and on substernal chest pressure since then that significantly worsened on day for arrival.  Radiates to right elbow. She notes associated nausea and vomit. No associated shortness of  breath. She called 911 and was noted to have an inferior STEMI. She received Aspirin 324mg , IV fluids, and Zofran en route to the ED.   Upon arrival to ED, patient stopped briefly for COVID swab and then taken immediately to cath lab for emergent cardiac catheterization.  Hospital Course     Consultants: None  1. Inferior STEMI - Emergent cath showed severe single-vessel coronary artery disease with thrombotic occlusion of the distal RCA involving the distal segment of old stent as well as unstented vessel beyond the stent.  There is also 80% stenosis involving the ostium of the RPDA. S/p Successful PCI to distal RCA using a Synergy 2.5 x 24 mm drug-eluting stent extending from the distal half of the old stent to just before the bifurcation. Also underwent PTCA only to RPDA reducing 80% stenosis to 20%.  - Medical therapy for LAD.  - Echo showed preserved LVEF - Continue DAPT with ASA and Effient.  - Continue statin and added BB at discharge  - Ambulated well  2. Post infarct pericarditis - She has mild chest pain and SOB with deep breath - She was placed on Colchine 0.6mg  which increase to BID dose at discharge. Length of therapy based on post hospital follow up.   3. HTN - BP was soft during admission and home Norvasc and ARB held.  - Started BB at discharge - Advised to start ARB back is SBP > 140 at home - Stop Amlodipine  4. HLD - 11/16/2019: Cholesterol 186; HDL 44; LDL Cholesterol 85; Triglycerides 285; VLDL 57  - Continue statin (Lipitor increase to 80mg  qd this admission).   5. DM - SSI while here - Resumed home medications.   The patient been seen by Dr. Angelena Form today and deemed ready for discharge home. All follow-up appointments have been scheduled. Discharge medications are listed below.   Did the patient have an acute coronary syndrome (MI, NSTEMI, STEMI, etc) this admission?:  Yes                               AHA/ACC Clinical Performance & Quality  Measures: 6. Aspirin prescribed? - Yes 7. ADP Receptor Inhibitor (Plavix/Clopidogrel, Brilinta/Ticagrelor or Effient/Prasugrel) prescribed (includes medically managed patients)? - Yes 8. Beta Blocker prescribed? - Yes 9. High Intensity Statin (Lipitor 40-80mg  or Crestor 20-40mg ) prescribed? - Yes 10. EF assessed during THIS hospitalization? - Yes 11. For EF <40%, was ACEI/ARB prescribed? - Not Applicable (EF >/= 16%) 12. For EF <40%, Aldosterone Antagonist (Spironolactone or Eplerenone) prescribed? - Not Applicable (EF >/= 10%) 13. Cardiac Rehab Phase II ordered (including medically managed patients)? - Yes   _____________  Discharge Vitals Blood pressure 126/62, pulse 88, temperature 99.8 F (37.7 C), temperature source Oral, resp. rate 19, SpO2 97 %.  There were no vitals filed for this visit.  Labs & Radiologic Studies    CBC Recent Labs    11/17/19 0229 11/18/19 0112  WBC 7.4 5.0  HGB 12.1 12.0  HCT 35.2* 35.0*  MCV 89.3 89.5  PLT 200 960*   Basic Metabolic Panel Recent Labs    11/17/19 0229 11/18/19 0112  NA 140 139  K 3.8 4.1  CL 106 105  CO2 24 24  GLUCOSE 195*  167*  BUN 16 19  CREATININE 0.90 0.93  CALCIUM 8.7* 8.3*   Liver Function Tests Recent Labs    11/16/19 1102  AST 34  ALT 64*  ALKPHOS 111  BILITOT 0.8  PROT 6.0*  ALBUMIN 3.4*   High Sensitivity Troponin:   Recent Labs  Lab 11/12/19 0503 11/16/19 1102 11/16/19 1517 11/17/19 0229 11/17/19 0757  TROPONINIHS 7 95* 11,625* 11,275* 8,576*    Hemoglobin A1C Recent Labs    11/16/19 1102  HGBA1C 6.8*   Fasting Lipid Panel Recent Labs    11/16/19 1102  CHOL 186  HDL 44  LDLCALC 85  TRIG 285*  CHOLHDL 4.2   _____________  DG Chest 2 View  Result Date: 11/11/2019 CLINICAL DATA:  Chest pain. EXAM: CHEST - 2 VIEW COMPARISON:  May 05, 2018 FINDINGS: There is no evidence of acute infiltrate, pleural effusion or pneumothorax. The heart size and mediastinal contours are within  normal limits. The visualized skeletal structures are unremarkable. IMPRESSION: No active cardiopulmonary disease. Electronically Signed   By: Virgina Norfolk M.D.   On: 11/11/2019 17:28   CARDIAC CATHETERIZATION  Result Date: 11/16/2019 Conclusions: 1. Severe single-vessel coronary artery disease with thrombotic occlusion of the distal RCA involving the distal segment of old stent as well as unstented vessel beyond the stent.  There is also 80% stenosis involving the ostium of the RPDA. 2. Moderate, noncritical disease involving the mid LAD, mid LCx, and proximal RCA of up to 50-60%. 3. Low normal left ventricular contraction with basal and mid inferior hypokinesis.  LVEF 50-55%. 4. Mildly elevated left ventricular filling pressure. 5. Successful PCI to distal RCA using a Synergy 2.5 x 24 mm drug-eluting stent extending from the distal half of the old stent to just before the bifurcation (postdilated to 2.9 mm) with 0% residual stenosis and TIMI-3 flow. 6. Successful PTCA to the ostium of RCA using Emerge 2.0 x 12 mm balloon with reduction of stenosis from 80% to 20%. Recommendations: 1. Admit to cardiac ICU for post STEMI monitoring. 2. Continue tirofiban infusion for 4 hours. 3. Dual antiplatelet therapy with aspirin and prasugrel for at least 12 months.  Given overlapping stents in the distal RCA and very late stent thrombosis, consider long-term DAPT. 4. Medical therapy of noncritical disease involving the LAD, LCx, and proximal RCA.  Functional study to assess hemodynamic significance of LAD disease should be considered when patient has recovered from acute MI. 5. Aggressive secondary prevention. Nelva Bush, MD [facility]   ECHOCARDIOGRAM COMPLETE  Result Date: 11/16/2019    ECHOCARDIOGRAM REPORT   Patient Name:   Sarah Ali Date of Exam: 11/16/2019 Medical Rec #:  [id number]    Height:       66.0 in Accession #:    [id number]   Weight:       180.0 lb Date of Birth:  05-Dec-1955     BSA:           1.912 m Patient Age:    64 years     BP:           95/59 mmHg Patient Gender: F            HR:           64 bpm. Exam Location:  Inpatient Procedure: 2D Echo, Color Doppler and Cardiac Doppler Indications:    STEMI  History:        Patient has prior history of Echocardiogram examinations, most  recent 11/26/2013. STEMI; Risk Factors:Hypertension, Diabetes and                 Dyslipidemia. Prior performed at [facility].  Sonographer:    Raquel Sarna Senior RDCS Referring Phys: Dante  1. Normal LV function; grade 1 diastolic dysfunction; mild LVH; small pericardial effusion.  2. Left ventricular ejection fraction, by estimation, is 55 to 60%. The left ventricle has normal function. The left ventricle has no regional wall motion abnormalities. There is mild left ventricular hypertrophy. Left ventricular diastolic parameters are consistent with Grade I diastolic dysfunction (impaired relaxation).  3. Right ventricular systolic function is normal. The right ventricular size is normal.  4. The mitral valve is normal in structure. No evidence of mitral valve regurgitation. No evidence of mitral stenosis.  5. The aortic valve is tricuspid. Aortic valve regurgitation is not visualized. No aortic stenosis is present.  6. The inferior vena cava is normal in size with greater than 50% respiratory variability, suggesting right atrial pressure of 3 mmHg. FINDINGS  Left Ventricle: Left ventricular ejection fraction, by estimation, is 55 to 60%. The left ventricle has normal function. The left ventricle has no regional wall motion abnormalities. The left ventricular internal cavity size was normal in size. There is  mild left ventricular hypertrophy. Left ventricular diastolic parameters are consistent with Grade I diastolic dysfunction (impaired relaxation). Right Ventricle: The right ventricular size is normal.Right ventricular systolic function is normal. Left Atrium: Left atrial size was normal in  size. Right Atrium: Right atrial size was normal in size. Pericardium: A small pericardial effusion is present. Mitral Valve: The mitral valve is normal in structure. Normal mobility of the mitral valve leaflets. No evidence of mitral valve regurgitation. No evidence of mitral valve stenosis. Tricuspid Valve: The tricuspid valve is normal in structure. Tricuspid valve regurgitation is trivial. No evidence of tricuspid stenosis. Aortic Valve: The aortic valve is tricuspid. Aortic valve regurgitation is not visualized. No aortic stenosis is present. Pulmonic Valve: The pulmonic valve was normal in structure. Pulmonic valve regurgitation is not visualized. No evidence of pulmonic stenosis. Aorta: The aortic root is normal in size and structure. Venous: The inferior vena cava is normal in size with greater than 50% respiratory variability, suggesting right atrial pressure of 3 mmHg. IAS/Shunts: No atrial level shunt detected by color flow Doppler. Additional Comments: Normal LV function; grade 1 diastolic dysfunction; mild LVH; small pericardial effusion.  LEFT VENTRICLE PLAX 2D LVIDd:         4.23 cm  Diastology LVIDs:         2.58 cm  LV e' lateral:   7.29 cm/s LV PW:         1.24 cm  LV E/e' lateral: 7.7 LV IVS:        1.14 cm  LV e' medial:    4.79 cm/s LVOT diam:     1.80 cm  LV E/e' medial:  11.8 LV SV:         50 LV SV Index:   26 LVOT Area:     2.54 cm  RIGHT VENTRICLE RV S prime:     18.30 cm/s TAPSE (M-mode): 2.3 cm LEFT ATRIUM             Index       RIGHT ATRIUM           Index LA diam:        3.20 cm 1.67 cm/m  RA Area:     12.20  cm LA Vol (A2C):   36.4 ml 19.04 ml/m RA Volume:   27.90 ml  14.59 ml/m LA Vol (A4C):   42.6 ml 22.28 ml/m LA Biplane Vol: 40.4 ml 21.13 ml/m  AORTIC VALVE LVOT Vmax:   85.70 cm/s LVOT Vmean:  57.400 cm/s LVOT VTI:    0.195 m  AORTA Ao Root diam: 2.80 cm Ao Asc diam:  2.80 cm MITRAL VALVE MV Area (PHT): 3.60 cm    SHUNTS MV Decel Time: 211 msec    Systemic VTI:  0.20 m MV E  velocity: 56.30 cm/s  Systemic Diam: 1.80 cm MV A velocity: 72.30 cm/s MV E/A ratio:  0.78 Kirk Ruths MD Electronically signed by Kirk Ruths MD Signature Date/Time: 11/16/2019/4:25:04 PM    Final    Disposition   Pt is being discharged home today in good condition.  Follow-up Plans & Appointments     Follow-up Information    Deberah Pelton, NP. Go on 12/08/2019.   Specialty: Cardiology Why: @2 :15pm for hospital follow up. Please arrive 10 minutes early  Contact information: [address] [place] [address] [phone]              Discharge Instructions    Amb Referral to Cardiac Rehabilitation   Complete by: As directed    Diagnosis:  Coronary Stents STEMI PTCA     After initial evaluation and assessments completed: Virtual Based Care may be provided alone or in conjunction with Phase 2 Cardiac Rehab based on patient barriers.: Yes   Diet - low sodium heart healthy   Complete by: As directed    Discharge instructions   Complete by: As directed    No driving for 10 days. No lifting over 10 lbs for 4 weeks. No sexual activity for 4 weeks. You may not return to work until cleared by your cardiologist. Keep procedure site clean & dry. If you notice increased pain, swelling, bleeding or pus, call/return!  You may shower, but no soaking baths/hot tubs/pools for 1 week.  Hold metformin today. Resume tomorrow on 11/19/19   Increase activity slowly   Complete by: As directed       Discharge Medications   Allergies as of 11/18/2019   No Known Allergies     Medication List    STOP taking these medications   amLODipine 5 MG tablet Commonly known as: NORVASC   hydrocortisone cream 1 %   ibuprofen 800 MG tablet Commonly known as: ADVIL     TAKE these medications   aspirin 81 MG tablet Take 81 mg by mouth daily.   atorvastatin 80 MG tablet Commonly known as: LIPITOR Take 1 tablet (80 mg total) by mouth daily at 6 PM. What changed:    medication strength  how much to take   carvedilol 3.125 MG tablet Commonly known as: Coreg Take 1 tablet (3.125 mg total) by mouth 2 (two) times daily.   colchicine 0.6 MG tablet Take 1 tablet (0.6 mg total) by mouth 2 (two) times daily.   fexofenadine 60 MG tablet Commonly known as: ALLEGRA Take 60 mg by mouth daily as needed for allergies or rhinitis.   Januvia 100 MG tablet Generic drug: sitaGLIPtin Take 100 mg by mouth daily.   Jardiance 25 MG Tabs tablet Generic drug: empagliflozin Take 25 mg by mouth daily.   metFORMIN 500 MG (MOD) 24 hr tablet Commonly known as: GLUMETZA Take 1,000 mg by mouth 2 (two) times daily with a meal.   methylPREDNISolone 4  MG Tbpk tablet Commonly known as: MEDROL DOSEPAK Take 4-24 mg by mouth See admin instructions. Take 6 tablets on day 1 then decrease by 1 tablet daily until all taken   multivitamin with minerals tablet Take 1 tablet by mouth daily.   nitroGLYCERIN 0.4 MG SL tablet Commonly known as: NITROSTAT Place 1 tablet (0.4 mg total) under the tongue every 5 (five) minutes as needed for chest pain.   olmesartan 40 MG tablet Commonly known as: BENICAR Take 40 mg by mouth daily.   prasugrel 10 MG Tabs tablet Commonly known as: EFFIENT Take 1 tablet (10 mg total) by mouth daily. Start taking on: November 19, 2019   tobramycin 0.3 % ophthalmic solution Commonly known as: TOBREX Place 2 drops into the left eye 4 (four) times daily. Started 11/09/19   Vitamin D 50 MCG (2000 UT) tablet Take 2,000 Units by mouth daily.   vitamin E 180 MG (400 UNITS) capsule Take 400 Units by mouth daily.        Outstanding Labs/Studies   BMET to follow up   Duration of Discharge Encounter   Greater than 30 minutes including physician time.  SignedLeanor Kail, PA 11/18/2019, 11:04 AM  I have personally seen and examined this patient. I agree with the assessment and plan as outlined above.  She is doing well. Mild CP with  deep inspiration only. Will continue ASA, Brilinta, statin and beta blocker. Colchicine BID for post MI pericardial pain. Discharge home today. Requesting Dr. Ellyn Hack or Dr. Randol Kern 11/18/2019 11:04 AM

## 2019-11-18 NOTE — Telephone Encounter (Signed)
Patient has a TOC appt on 12/08/19 at 2:15pm with Coletta Memos PA.

## 2019-11-18 NOTE — Progress Notes (Signed)
CARDIAC REHAB PHASE I   PRE:  Rate/Rhythm: 84 SR    BP: sitting 126/56    SaO2:   MODE:  Ambulation: 470 ft   POST:  Rate/Rhythm: 103 ST    BP: sitting 121/68     SaO2:   Pt c/o feeling tired and a HA today. Didn't sleep well. Also still can't take deep breath. Has IS, only 750 mL. Tolerated walk ok. Generally flat affect.  No questions regarding education.  [phone]  [place], ACSM 11/18/2019 10:08 AM

## 2019-11-18 NOTE — Telephone Encounter (Signed)
Still admitted

## 2019-11-19 ENCOUNTER — Telehealth (HOSPITAL_COMMUNITY): Payer: Self-pay

## 2019-11-19 ENCOUNTER — Telehealth: Payer: Self-pay

## 2019-11-19 NOTE — Telephone Encounter (Signed)
Pt insurance is active and benefits verified through [facility]. Co-pay $0.00, DED $0.00/$0.00 met, out of pocket $4,200.00/$33.19 met, co-insurance 20%. No pre-authorization required. Passport, 11/19/19 @ 3:44PM, BEM#[id number]  Will contact patient to see if she is interested in the Cardiac Rehab Program. If interested, patient will need to complete follow up appt. Once completed, patient will be contacted for scheduling upon review by the RN Navigator.

## 2019-11-19 NOTE — Telephone Encounter (Signed)
Patient contacted regarding discharge from [place] on 11/18/19.  Patient understands to follow up with provider 12-08-19 on [place]  at 2:156 pm at [facility]. Patient understands discharge instructions? yes Patient understands medications and regiment? yes Patient understands to bring all medications to this visit? yes  Ask patient:  Are you enrolled in My Chart (yes or no)  If no ask patient if they would like to enroll.

## 2019-11-19 NOTE — Telephone Encounter (Signed)
Attempted to call patient in regards to Cardiac Rehab - LM on VM 

## 2019-11-19 NOTE — Telephone Encounter (Signed)
Notes faxed to [facility]

## 2019-11-24 ENCOUNTER — Telehealth (HOSPITAL_COMMUNITY): Payer: Self-pay | Admitting: *Deleted

## 2019-11-24 NOTE — Telephone Encounter (Signed)
Received requested office visit notes from cardiologist Dr. Sheila Oats at [facility].  Pt seen in follow up on 7/19. Reviewed information. Pt also has a follow up appt with local cardiologist on 8/3.  Called and left message for pt seeking her intent for cardiology management.  This will determine which MD will need to sign her referral for cardiac rehab.  Await return call. Cherre Huger, BSN Cardiac and Training and development officer

## 2019-11-25 ENCOUNTER — Telehealth (HOSPITAL_COMMUNITY): Payer: Self-pay | Admitting: *Deleted

## 2019-11-25 ENCOUNTER — Telehealth (HOSPITAL_COMMUNITY): Payer: Self-pay

## 2019-11-25 NOTE — Telephone Encounter (Signed)
Pt returned my call from message left on yesterday.  Pt will continue to see Dr. Sheila Oats at [facility] however she will keep her follow up appt on 8/3 with local cardiologist.  Pt is very eager to get started in CR.  Pt will have an insurance change in September.  Pt has already completed an appt with Dr. Sheila Oats on 7/19.  Will send paper referral for him to sign.  Once we receive this back, can proceed with scheduling or if pt completes her follow up on 8/3 satisfactorily.  Will have support staff verify her present insurance. Cherre Huger, BSN Cardiac and Training and development officer

## 2019-11-25 NOTE — Telephone Encounter (Signed)
Faxed pt cardiology office at [facility]. Sheila Oats an MD order.

## 2019-12-04 NOTE — Telephone Encounter (Signed)
Pt called in regards to CR, adv pt of note below and that she would need to keep and complete her f/u appt on 8/3. Patient verbalized understanding. Patient will come in for orientation on 01/05/20 @ 2PM and will attend the 315PM exercise class. Pt also stated her current insur policy will term at the end of Sep., adv pt to provide updated insurance information when recv'ed.  Tourist information centre manager.

## 2019-12-07 NOTE — Progress Notes (Signed)
Cardiology Clinic Note   Patient Name: Sarah Ali Date of Encounter: 12/08/2019  Primary Care Provider:  Merrilee Seashore, MD Primary Cardiologist:  Sarah Field, MD  Patient Profile    And has Sarah Ali 64 year old female presents the clinic today for follow-up evaluation of her STEMI.  Past Medical History    Past Medical History:  Diagnosis Date  . Asthma    childhood  . BRCA1 positive   . Bronchitis   . Diabetes mellitus without complication ([place])   . Eczema   . Family history of breast cancer   . Family history of ovarian cancer   . High cholesterol   . Hypertension   . Pneumonia    Past Surgical History:  Procedure Laterality Date  . BREAST BIOPSY    . BREAST SURGERY  07/05/2016   biopsy  . COLONOSCOPY    . CORONARY ANGIOPLASTY  2015  . CORONARY/GRAFT ACUTE MI REVASCULARIZATION N/A 11/16/2019   Procedure: Coronary/Graft Acute MI Revascularization;  Surgeon: Sarah Bush, MD;  Location: [facility];  Service: Cardiovascular;  Laterality: N/A;  . DILATATION & CURRETTAGE/HYSTEROSCOPY WITH RESECTOCOPE N/A 07/19/2016   Procedure: DILATATION & CURETTAGE/HYSTEROSCOPY WITH RESECTION OF ENDOMETRIAL POLYP WITH MYOSURE;  Surgeon: Sarah Salina, MD;  Location: [facility];  Service: Gynecology;  Laterality: N/A;  . LEFT HEART CATH AND CORONARY ANGIOGRAPHY N/A 11/16/2019   Procedure: LEFT HEART CATH AND CORONARY ANGIOGRAPHY;  Surgeon: Sarah Bush, MD;  Location: [facility];  Service: Cardiovascular;  Laterality: N/A;  . ROBOTIC ASSISTED BILATERAL SALPINGO OOPHERECTOMY Bilateral 07/19/2016   Procedure: ROBOTIC ASSISTED BILATERAL SALPINGO OOPHORECTOMY AND LYSIS OF ADHESIONS, PELVIC WASHINGS;  Surgeon: Sarah Salina, MD;  Location: [facility];  Service: Gynecology;  Laterality: Bilateral;  DO NOT DRAPE THE ROBOT     Allergies  No Known Allergies  History of Present Illness    Sarah Ali has a PMH of CAD status post DES to the RCA in 2015, HTN,  HLD, type 2 diabetes.  She was seen at [facility] emergency department 11/11/2019 for intermittent chest pressure.  She had no associated symptoms at that time.  Her EKG showed no acute ischemic changes.  Her high-sensitivity troponins were negative x3.  Her D-dimer was also negative.  She was discharged with instructions to follow-up with outpatient cardiology.    She continued to have intermittent substernal chest pressure.  She indicated that the pain radiated to her elbow.  She had associated nausea and vomiting.  She did not have associated shortness of breath.  She called 911 and was noted to have inferior STEMI.  She received 324 of aspirin, IV fluids, and Zofran in route to the emergency department 11/16/2019.  She underwent cardiac catheterization and was found to have severe single-vessel disease with thrombotic occlusion of the distal RCA involving the distal segment of her old stent as well as an unstented vessel distal to her stent.  There was an 80% stenosis involving ostium of the RPDA.  She was also noted to have moderate mid LAD, mid circumflex and proximal RCA disease ranging from 50 to 60%.  Her LVEF was 50-55%.  She received PCI to distal RCA with a DES x1.  She also had successful PTCA of the ostium of the RCA with reduction of stenosis from 80% to 20%.  She was placed on DAPT and discharged on 11/18/2019 in stable condition.  She presents to the clinic today for follow-up evaluation and states she has been fairly fatigued since her  cardiac catheterization.  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  She states that she knew she was having a heart attack due to her chest and arm pain that she experienced while she was being physically active in her garden.  She has stopped her colchicine due to GI distress.  She states that she has no chest discomfort when laying down and has not had chest discomfort since her cardiac catheterization.  She would like a  referral to a nutritionist to talk about diet.  We reviewed the importance of lowering cholesterol and a goal of LDL less than 70.  I will give her the salty 6 diet sheet, heart healthy diet plan, have her increase her physical activity slowly, and have her follow-up with Dr. Davina Poke in 3 months.  Today she denies chest pain, shortness of breath, lower extremity edema, fatigue, palpitations, melena, hematuria, hemoptysis, diaphoresis, weakness, presyncope, syncope, orthopnea, and PND.   Home Medications    Prior to Admission medications   Medication Sig Start Date End Date Taking? Authorizing Provider  aspirin 81 MG tablet Take 81 mg by mouth daily.    [provider]  atorvastatin (LIPITOR) 80 MG tablet Take 1 tablet (80 mg total) by mouth daily at 6 PM. 11/18/19   Sarah Ali, Bhavinkumar, PA  carvedilol (COREG) 3.125 MG tablet Take 1 tablet (3.125 mg total) by mouth 2 (two) times daily. 11/18/19 11/17/20  Leanor Kail, PA  Cholecalciferol (VITAMIN D) 2000 units tablet Take 2,000 Units by mouth daily.    [provider]  colchicine 0.6 MG tablet Take 1 tablet (0.6 mg total) by mouth 2 (two) times daily. 11/18/19   Sarah Ali, Crista Luria, PA  empagliflozin (JARDIANCE) 25 MG TABS tablet Take 25 mg by mouth daily. Patient not taking: Reported on 11/16/2019    [provider]  fexofenadine (ALLEGRA) 60 MG tablet Take 60 mg by mouth daily as needed for allergies or rhinitis.    [provider]  JANUVIA 100 MG tablet Take 100 mg by mouth daily. 09/21/19   [provider]  metFORMIN (GLUMETZA) 500 MG (MOD) 24 hr tablet Take 1,000 mg by mouth 2 (two) times daily with a meal.     [provider]  methylPREDNISolone (MEDROL DOSEPAK) 4 MG TBPK tablet Take 4-24 mg by mouth See admin instructions. Take 6 tablets on day 1 then decrease by 1 tablet daily until all taken Patient not taking: Reported on 11/16/2019 11/09/19   [provider]  Multiple  Vitamins-Minerals (MULTIVITAMIN WITH MINERALS) tablet Take 1 tablet by mouth daily.    [provider]  nitroGLYCERIN (NITROSTAT) 0.4 MG SL tablet Place 1 tablet (0.4 mg total) under the tongue every 5 (five) minutes as needed for chest pain. 11/18/19   Sarah Ali, Crista Luria, PA  olmesartan (BENICAR) 40 MG tablet Take 40 mg by mouth daily.    [provider]  prasugrel (EFFIENT) 10 MG TABS tablet Take 1 tablet (10 mg total) by mouth daily. 11/19/19   Sarah Ali, Bhavinkumar, PA  tobramycin (TOBREX) 0.3 % ophthalmic solution Place 2 drops into the left eye 4 (four) times daily. Started 11/09/19 Patient not taking: Reported on 11/16/2019 11/09/19   [provider]  vitamin E 400 UNIT capsule Take 400 Units by mouth daily.    [provider]    Family History    Family History  Problem Relation Age of Onset  . Colon cancer Mother 56  . Ovarian cancer  Mother 95  . Skin cancer Father   . BRCA 1/2 Brother        BRCA1 negative  . Breast cancer Maternal Aunt        dx in her 62s and again at 32  . BRCA 1/2 Maternal Aunt        BRCA1 positive  . Colon cancer Paternal Aunt        dx in her 26s  . Breast cancer Maternal Grandmother   . Parkinson's disease Maternal Grandfather 62   She indicated that her mother is deceased. She indicated that her father is deceased. She indicated that her sister is alive. She indicated that her brother is alive. She indicated that her maternal grandmother is deceased. She indicated that her maternal grandfather is deceased. She indicated that her paternal grandmother is deceased. She indicated that her paternal grandfather is deceased. She indicated that her maternal aunt is alive. She indicated that her paternal aunt is deceased.  Social History    Social History   Socioeconomic History  . Marital status: Widowed    Spouse name: Not on file  . Number of children: 3  . Years of education: Not on file  . Highest education level: Not  on file  Occupational History  . Not on file  Tobacco Use  . Smoking status: Never Smoker  . Smokeless tobacco: Never Used  Substance and Sexual Activity  . Alcohol use: Yes    Comment: occ  . Drug use: No  . Sexual activity: Not on file  Other Topics Concern  . Not on file  Social History Narrative  . Not on file   Social Determinants of Health   Financial Resource Strain:   . Difficulty of Paying Living Expenses:   Food Insecurity:   . Worried About Charity fundraiser in the Last Year:   . Arboriculturist in the Last Year:   Transportation Needs:   . Film/video editor (Medical):   Marland Kitchen Lack of Transportation (Non-Medical):   Physical Activity:   . Days of Exercise per Week:   . Minutes of Exercise per Session:   Stress:   . Feeling of Stress :   Social Connections:   . Frequency of Communication with Friends and Family:   . Frequency of Social Gatherings with Friends and Family:   . Attends Religious Services:   . Active Member of Clubs or Organizations:   . Attends Archivist Meetings:   Marland Kitchen Marital Status:   Intimate Partner Violence:   . Fear of Current or Ex-Partner:   . Emotionally Abused:   Marland Kitchen Physically Abused:   . Sexually Abused:      Review of Systems    General:  No chills, fever, night sweats or weight changes.  Cardiovascular:  No chest pain, dyspnea on exertion, edema, orthopnea, palpitations, paroxysmal nocturnal dyspnea. Dermatological: No rash, lesions/masses Respiratory: No cough, dyspnea Urologic: No hematuria, dysuria Abdominal:   No nausea, vomiting, diarrhea, bright red blood per rectum, melena, or hematemesis Neurologic:  No visual changes, wkns, changes in mental status. All other systems reviewed and are otherwise negative except as noted above.  Physical Exam    VS:  BP 130/77   Pulse 63   Temp 97.7 F (36.5 C)   Ht _0  (1.676 m)   Wt 179 lb (81.2 kg)   SpO2 99%   BMI 28.89 kg/m  , BMI Body mass index is 28.89  kg/m. GEN: Well  nourished, well developed, in no acute distress. HEENT: normal. Neck: Supple, no JVD, carotid bruits, or masses. Cardiac: RRR, no murmurs, rubs, or gallops. No clubbing, cyanosis, edema.  Radials/DP/PT 2+ and equal bilaterally.  Respiratory:  Respirations regular and unlabored, clear to auscultation bilaterally. GI: Soft, nontender, nondistended, BS + x 4. MS: no deformity or atrophy. Skin: warm and dry, no rash. Neuro:  Strength and sensation are intact. Psych: Normal affect.  Accessory Clinical Findings    Recent Labs: 11/16/2019: ALT 64 11/18/2019: BUN 19; Creatinine, Ser 0.93; Hemoglobin 12.0; Platelets 146; Potassium 4.1; Sodium 139   Recent Lipid Panel    Component Value Date/Time   CHOL 186 11/16/2019 1102   TRIG 285 (H) 11/16/2019 1102   HDL 44 11/16/2019 1102   CHOLHDL 4.2 11/16/2019 1102   VLDL 57 (H) 11/16/2019 1102   LDLCALC 85 11/16/2019 1102    ECG personally reviewed by me today-normal sinus rhythm ST and marked T wave abnormality consider inferolateral ischemia 63 bpm  EKG 11/17/2019 Normal sinus rhythm with sinus arrhythmia inferior infarct undetermined age 7 bpm  Echocardiogram 11/16/2019  1. Normal LV function; grade 1 diastolic dysfunction; mild LVH; small  pericardial effusion.  2. Left ventricular ejection fraction, by estimation, is 55 to 60%. The  left ventricle has normal function. The left ventricle has no regional  wall motion abnormalities. There is mild left ventricular hypertrophy.  Left ventricular diastolic parameters  are consistent with Grade I diastolic dysfunction (impaired relaxation).  3. Right ventricular systolic function is normal. The right ventricular  size is normal.  4. The mitral valve is normal in structure. No evidence of mitral valve  regurgitation. No evidence of mitral stenosis.  5. The aortic valve is tricuspid. Aortic valve regurgitation is not  visualized. No aortic stenosis is present.  6. The  inferior vena cava is normal in size with greater than 50%  respiratory variability, suggesting right atrial pressure of 3 mmHg.    LHC 11/16/2019 Conclusions: 1. Severe single-vessel coronary artery disease with thrombotic occlusion of the distal RCA involving the distal segment of old stent as well as unstented vessel beyond the stent. There is also 80% stenosis involving the ostium of the RPDA. 2. Moderate, noncritical disease involving the mid LAD, mid LCx, and proximal RCA of up to 50-60%. 3. Low normal left ventricular contraction with basal and mid inferior hypokinesis. LVEF 50-55%. 4. Mildly elevated left ventricular filling pressure. 5. Successful PCI to distal RCA using a Synergy 2.5 x 24 mm drug-eluting stent extending from the distal half of the old stent to just before the bifurcation (postdilated to 2.9 mm) with 0% residual stenosis and TIMI-3 flow. 6. Successful PTCA to the ostium of RCA using Emerge 2.0 x 12 mm balloon with reduction of stenosis from 80% to 20%.  Recommendations: 1. Admit to cardiac ICU for post STEMI monitoring. 2. Continue tirofiban infusion for 4 hours. 3. Dual antiplatelet therapy with aspirin and prasugrel for at least 12 months. Given overlapping stents in the distal RCA and very late stent thrombosis, consider long-term DAPT. 4. Medical therapy of noncritical disease involving the LAD, LCx, and proximal RCA. Functional study to assess hemodynamic significance of LAD disease should be considered when patient has recovered from acute MI. 5. Aggressive secondary prevention.  Sarah Bush, MD [facility]   Diagnostic Dominance: Right  Intervention      Assessment & Plan   1.  STEMI-presented to the emergency department 11/16/2019 with severe chest pain, N/V, and pain  in her elbow.  She was taken for emergent cardiac catheterization which showed severe single-vessel CAD with a thrombotic occlusion of her distal RCA that involve the  distal segment of her stent.  There is also an 80% stenosis lesion of the ostium of her R PDA.  She received DES x1 and PTCA only of RPDA which reduced the lesion from 80% to 20%.  Echocardiogram showed LVEF of 55-60%.  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  Previously noted to have mild chest pain and shortness of breath with deep inspiration. Continue colchicine twice daily Heart healthy low-sodium diet-salty 6 given Increase physical activity as tolerated  Hypertension-BP today 130/77.  Well-controlled at home.  Norvasc and ARB held at the hospital due to hypotension during admission. Continue carvedilol Heart healthy low-sodium diet-salty 6 given Increase physical activity as tolerated  Hyperlipidemia-11/16/2019: Cholesterol 186; HDL 44; LDL Cholesterol 85; Triglycerides 285; VLDL 57 Continue atorvastatin Heart healthy low-sodium diet-salty 6 given Increase physical activity as tolerated Repeat lipid and lft's in 4 weeks   Diabetes mellitus-A1c 6.8 on 11/16/2019 Continue Jardiance, Metformin Followed by PCP  Disposition: Follow-up with Dr. Davina Poke in 3 months.   Jossie Ng. Keiton Cosma NP-C    12/08/2019, 3:47 PM [address] Office [phone] Fax [phone]  Notice: This dictation was prepared with Dragon dictation along with smaller phrase technology. Any transcriptional errors that result from this process are unintentional and may not be corrected upon review.

## 2019-12-08 ENCOUNTER — Other Ambulatory Visit: Payer: Self-pay

## 2019-12-08 ENCOUNTER — Ambulatory Visit (INDEPENDENT_AMBULATORY_CARE_PROVIDER_SITE_OTHER): Payer: 59 | Admitting: General Practice

## 2019-12-08 ENCOUNTER — Encounter: Payer: Self-pay | Admitting: General Practice

## 2019-12-08 VITALS — BP 130/77 | HR 63 | Temp 97.7°F | Ht 66.0 in | Wt 179.0 lb

## 2019-12-08 DIAGNOSIS — Z79899 Other long term (current) drug therapy: Secondary | ICD-10-CM

## 2019-12-08 DIAGNOSIS — I308 Other forms of acute pericarditis: Secondary | ICD-10-CM

## 2019-12-08 DIAGNOSIS — E7800 Pure hypercholesterolemia, unspecified: Secondary | ICD-10-CM | POA: Diagnosis not present

## 2019-12-08 DIAGNOSIS — I10 Essential (primary) hypertension: Secondary | ICD-10-CM

## 2019-12-08 DIAGNOSIS — I2111 ST elevation (STEMI) myocardial infarction involving right coronary artery: Secondary | ICD-10-CM

## 2019-12-08 DIAGNOSIS — E118 Type 2 diabetes mellitus with unspecified complications: Secondary | ICD-10-CM

## 2019-12-08 MED ORDER — PRASUGREL HCL 10 MG PO TABS: 10.0000 mg | ORAL_TABLET | Freq: Every day | ORAL | 3 refills | Status: DC

## 2019-12-08 MED ORDER — ATORVASTATIN CALCIUM 80 MG PO TABS: 80.0000 mg | ORAL_TABLET | Freq: Every day | ORAL | 3 refills | Status: DC

## 2019-12-08 MED ORDER — CARVEDILOL 3.125 MG PO TABS: 3.1250 mg | ORAL_TABLET | Freq: Two times a day (BID) | ORAL | 3 refills | Status: DC

## 2019-12-08 NOTE — Patient Instructions (Signed)
Medication Instructions:  The current medical regimen is effective;  continue present plan and medications as directed. Please refer to the Current Medication list given to you today. *If you need a refill on your cardiac medications before your next appointment, please call your pharmacy*  Lab Work: Fasting lipid and LFT in 4 weeks (01-08-2020) If you have labs (blood work) drawn today and your tests are completely normal, you will receive your results only by:  [place] (if you have MyChart) OR A paper copy in the mail.  If you have any lab test that is abnormal or we need to change your treatment, we will call you to review the results. You may go to any [facility] that is convenient for you however, we do have a lab in our office that is able to assist you. You DO NOT need an appointment for our lab. The lab is open 8:00am and closes at 4:00pm. Lunch 12:45 - 1:45pm.  Special Instructions SOMEONE WILL BE CALLING YOU TO DISCUSS DIETARY SOLUTIONS  TAKE AND LOG YOUR BLOOD PRESSURE  PLEASE READ AND FOLLOW SALTY 6-ATTACHED  PLEASE INCREASE PHYSICAL ACTIVITY AS TOLERATED  Follow-Up: Your next appointment:  3 week(s)In Person with Eleonore Chiquito, MD  At [facility], you and your health needs are our priority.  As part of our continuing mission to provide you with exceptional heart care, we have created designated Provider Care Teams.  These Care Teams include your primary Cardiologist (physician) and Advanced Practice Providers (APPs -  Physician Assistants and Nurse Practitioners) who all work together to provide you with the care you need, when you need it.

## 2019-12-11 ENCOUNTER — Telehealth: Payer: Self-pay | Admitting: General Practice

## 2019-12-11 NOTE — Telephone Encounter (Signed)
Grier Rocher, I seen you sent this to [facility] and with her being out- okay to change date to 08/09. I will write letter.  Thanks!

## 2019-12-11 NOTE — Telephone Encounter (Signed)
Patient is calling to follow up in regards to request for note stating that she is eligible to return to work on 12/14/19 (see patient message received on 12/09/19).

## 2019-12-11 NOTE — Telephone Encounter (Signed)
Called patient- and advised it was sent to mychart account.  Patient viewed the letter while on the phone, patient states she will have this to them.  Patient thankful for call back.

## 2019-12-14 ENCOUNTER — Telehealth: Payer: Self-pay

## 2019-12-14 DIAGNOSIS — Z0000 Encounter for general adult medical examination without abnormal findings: Secondary | ICD-10-CM

## 2019-12-14 NOTE — Telephone Encounter (Signed)
Called patient to discuss nutritional needs for a specific heart healthy diet. Patient is in need of a dietician to identify the proper diet. Will put in a referral for the patient. Patient is also interested in health coaching after seeing a dietician to get clarity on how to implement this new diet into her daily routine. Will contact the patient after her appointment with dietician. Patient has Care Guide's direct number [phone].

## 2019-12-14 NOTE — Telephone Encounter (Signed)
Per JC, FNP-C:  Please draft a work note for Sarah Ali. She may return to work at the end of the week. I will not place any restrictions on her. However, I would like her to increase her physical activity slowly. Thank you  Left detailed message-[id number] WITH INFO ABOVE

## 2019-12-30 ENCOUNTER — Encounter (HOSPITAL_COMMUNITY): Payer: Self-pay

## 2019-12-30 ENCOUNTER — Telehealth (HOSPITAL_COMMUNITY): Payer: Self-pay

## 2019-12-30 NOTE — Telephone Encounter (Signed)
Hi Michelle,   I put in a referral for a consult to a dietician on 12/14/19. I see that an appt has not been assigned yet. I will follow up with the patient and the referral to see what I can do.  Thanks, [place] Chelf

## 2019-12-30 NOTE — Telephone Encounter (Signed)
Cardiac Rehab Note:  Unsuccessful telephone encounter to Arihana Ambrocio to confirm cardiac rehab orientation appointment for 01/05/20 at 2:00 pm. [employer] compliant VM message left requesting call back to [phone].  Edia Pursifull E. Rollene Rotunda RN, BSN [place]. [facility]  Cardiac and Pulmonary Rehabilitation Phone: [phone] Fax: [phone]

## 2019-12-31 ENCOUNTER — Telehealth: Payer: Self-pay

## 2019-12-31 DIAGNOSIS — Z0000 Encounter for general adult medical examination without abnormal findings: Secondary | ICD-10-CM

## 2019-12-31 NOTE — Telephone Encounter (Signed)
Called the patient to follow up with referral to seek help with a healthy diet. Original referral was closed that was entered on 12/14/19. Patient stated that she received a msg via MyChart regarding the referral informing her that she does not qualify for services with a dietician because her BMI is not 30 or greater.   Care Guide entered another referral for Nutrition and Diabetic counseling on today to see if she qualifies for this service. Patient is diabetic and hypertensive and is seeking nutritional advice to manage both conditions.  Will follow referral and follow up with patient.

## 2020-01-01 ENCOUNTER — Encounter (HOSPITAL_COMMUNITY)
Admission: RE | Admit: 2020-01-01 | Discharge: 2020-01-01 | Disposition: A | Discharge status: Home / Self Care | Payer: 59 | Source: Ambulatory Visit | Attending: Internal Medicine | Admitting: Internal Medicine

## 2020-01-01 ENCOUNTER — Other Ambulatory Visit: Payer: Self-pay

## 2020-01-01 DIAGNOSIS — I2111 ST elevation (STEMI) myocardial infarction involving right coronary artery: Secondary | ICD-10-CM | POA: Insufficient documentation

## 2020-01-01 NOTE — Progress Notes (Addendum)
Cardiac Rehab Telephone Note:  Successful telephone encounter to Sarah Ali to confirm Cardiac Rehab orientation appointment for 01/05/20 at 2:00 pm. Nursing assessment completed. Patient questions answered. Instructions for appointment provided. Patient screening for Covid-19 negative.  Tyrann Donaho E. Rollene Rotunda RN, BSN [place]. [facility]  Cardiac and Pulmonary Rehabilitation Phone: [phone] Fax: [phone]

## 2020-01-04 ENCOUNTER — Telehealth (HOSPITAL_COMMUNITY): Payer: Self-pay | Admitting: Pharmacist

## 2020-01-04 NOTE — Telephone Encounter (Signed)
Patient was unable to be contacted at this time    Sarah Ali 01/04/2020 1:39 PM

## 2020-01-05 ENCOUNTER — Encounter (HOSPITAL_COMMUNITY)
Admission: RE | Admit: 2020-01-05 | Discharge: 2020-01-05 | Disposition: A | Discharge status: Home / Self Care | Payer: 59 | Source: Ambulatory Visit | Attending: Internal Medicine | Admitting: Internal Medicine

## 2020-01-05 ENCOUNTER — Encounter (HOSPITAL_COMMUNITY): Payer: Self-pay

## 2020-01-05 ENCOUNTER — Other Ambulatory Visit: Payer: Self-pay

## 2020-01-05 VITALS — BP 158/82 | HR 70 | Resp 18 | Ht 65.75 in | Wt 175.5 lb

## 2020-01-05 DIAGNOSIS — I2111 ST elevation (STEMI) myocardial infarction involving right coronary artery: Secondary | ICD-10-CM

## 2020-01-05 NOTE — Progress Notes (Signed)
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  aspirin 81 MG tablet, Take 81 mg by mouth daily., Disp: , Rfl:  .  atorvastatin (LIPITOR) 80 MG tablet, Take 1 tablet (80 mg total) by mouth daily at 6 PM., Disp: 90 tablet, Rfl: 3 .  carvedilol (COREG) 6.25 MG tablet, Take 6.25 mg by mouth 2 (two) times daily., Disp: , Rfl:  .  Cholecalciferol (VITAMIN D) 2000 units tablet, Take 2,000 Units by mouth daily., Disp: , Rfl:  .  fexofenadine (ALLEGRA) 60 MG tablet, Take 60 mg by mouth daily as needed for allergies or rhinitis., Disp: , Rfl:  .  JANUVIA 100 MG tablet, Take 100 mg by mouth daily., Disp: , Rfl:  .  metFORMIN (GLUMETZA) 500 MG (MOD) 24 hr tablet, Take 1,000 mg by mouth 2 (two) times daily with a meal. , Disp: , Rfl:  .  Multiple Vitamins-Minerals (MULTIVITAMIN WITH MINERALS) tablet, Take 1 tablet by mouth daily., Disp: , Rfl:  .  nitroGLYCERIN (NITROSTAT) 0.4 MG SL tablet, Place 1 tablet (0.4 mg total) under the tongue every 5 (five) minutes as needed for chest pain., Disp: 25 tablet, Rfl: 12 .  olmesartan (BENICAR) 40 MG tablet, Take 40 mg by mouth daily., Disp: , Rfl:  .  prasugrel (EFFIENT) 10 MG TABS tablet, Take 1 tablet (10 mg total) by mouth daily., Disp: 90 tablet, Rfl: 3 .  vitamin E 400 UNIT capsule, Take 400 Units by mouth daily., Disp: , Rfl:   Past Medical History: Past Medical History:  Diagnosis Date  . Asthma    childhood  .  BRCA1 positive   . Bronchitis   . Diabetes mellitus without complication ([place])   . Eczema   . Family history of breast cancer   . Family history of ovarian cancer   . High cholesterol   . Hypertension   . Pneumonia     Tobacco Use: Social History   Tobacco Use  Smoking Status Never Smoker  Smokeless Tobacco Never Used    Labs: Recent Review Scientist, physiological    Labs for ITP Cardiac and Pulmonary Rehab Latest Ref Rng & Units 11/16/2019   Cholestrol 0 - 200 mg/dL 186   LDLCALC 0 - 99 mg/dL 85   HDL >40 mg/dL 44   Trlycerides <150 mg/dL 285(H)   Hemoglobin A1c 4.8 - 5.6 % 6.8(H)      Capillary Blood Glucose: Lab Results  Component Value Date   GLUCAP 163 (H) 11/18/2019   GLUCAP 154 (H) 11/18/2019   GLUCAP 146 (H) 11/17/2019   GLUCAP 145 (H) 11/17/2019   GLUCAP 157 (H) 11/17/2019     Exercise Target Goals: Exercise Program Goal: Individual exercise prescription set using results from initial 6 min walk test and THRR while considering  patient's activity barriers and safety.   Exercise Prescription Goal: Initial exercise prescription builds to 30-45 minutes a day of aerobic activity, 2-3 days per week.  Home exercise guidelines will be given  to patient during program as part of exercise prescription that the participant will acknowledge.  Activity Barriers & Risk Stratification:  Activity Barriers & Cardiac Risk Stratification - 01/05/20 1542      Activity Barriers & Cardiac Risk Stratification   Activity Barriers Balance Concerns   Scored 4.02 seconds on balance test   Cardiac Risk Stratification High           6 Minute Walk:  6 Minute Walk    Row Name 01/05/20 1540         6 Minute Walk   Phase Initial     Distance 1702 feet     Walk Time 6 minutes     # of Rest Breaks 0     MPH 3.22     METS 4.21     RPE 11     Perceived Dyspnea  1     VO2 Peak 14.72     Symptoms Yes (comment)     Comments Mild SOB, RPD = 1     Resting HR 70 bpm     Resting BP  158/82     Resting Oxygen Saturation  96 %     Exercise Oxygen Saturation  during 6 min walk 97 %     Max Ex. HR 97 bpm     Max Ex. BP 162/86     2 Minute Post BP 140/72            Oxygen Initial Assessment:   Oxygen Re-Evaluation:   Oxygen Discharge (Final Oxygen Re-Evaluation):   Initial Exercise Prescription:  Initial Exercise Prescription - 01/05/20 1500      Date of Initial Exercise RX and Referring Provider   Date 01/05/20    Referring Provider End, Harrell Gave    Expected Discharge Date 03/04/20      Treadmill   MPH 2.5    Grade 0    Minutes 15    METs 2.91      NuStep   Level 2    SPM 75    Minutes 15    METs 2.2      Prescription Details   Frequency (times per week) 3    Duration Progress to 30 minutes of continuous aerobic without signs/symptoms of physical distress      Intensity   THRR 40-80% of Max Heartrate 64-127    Ratings of Perceived Exertion 11-13    Perceived Dyspnea 0-4      Progression   Progression Continue progressive overload as per policy without signs/symptoms or physical distress.      Resistance Training   Training Prescription Yes    Weight 3 lbs    Reps 10-15           Perform Capillary Blood Glucose checks as needed.  Exercise Prescription Changes:   Exercise Comments:   Exercise Goals and Review:   Exercise Goals    Row Name 01/05/20 1545             Exercise Goals   Increase Physical Activity Yes       Intervention Provide advice, education, support and counseling about physical activity/exercise needs.;Develop an individualized exercise prescription for aerobic and resistive training based on initial evaluation findings, risk stratification, comorbidities and participant's personal goals.       Expected Outcomes Short Term: Attend rehab on a regular basis to increase amount of physical activity.;Long Term: Add in home exercise to make exercise part of routine and to increase amount of physical  activity.;Long  Term: Exercising regularly at least 3-5 days a week.       Increase Strength and Stamina Yes       Intervention Provide advice, education, support and counseling about physical activity/exercise needs.;Develop an individualized exercise prescription for aerobic and resistive training based on initial evaluation findings, risk stratification, comorbidities and participant's personal goals.       Expected Outcomes Short Term: Increase workloads from initial exercise prescription for resistance, speed, and METs.;Short Term: Perform resistance training exercises routinely during rehab and add in resistance training at home;Long Term: Improve cardiorespiratory fitness, muscular endurance and strength as measured by increased METs and functional capacity (6MWT)       Able to understand and use rate of perceived exertion (RPE) scale Yes       Intervention Provide education and explanation on how to use RPE scale       Expected Outcomes Short Term: Able to use RPE daily in rehab to express subjective intensity level;Long Term:  Able to use RPE to guide intensity level when exercising independently       Knowledge and understanding of Target Heart Rate Range (THRR) Yes       Intervention Provide education and explanation of THRR including how the numbers were predicted and where they are located for reference       Expected Outcomes Short Term: Able to state/look up THRR;Short Term: Able to use daily as guideline for intensity in rehab;Long Term: Able to use THRR to govern intensity when exercising independently       Able to check pulse independently Yes       Intervention Provide education and demonstration on how to check pulse in carotid and radial arteries.;Review the importance of being able to check your own pulse for safety during independent exercise       Expected Outcomes Short Term: Able to explain why pulse checking is important during independent exercise;Long Term: Able to check  pulse independently and accurately       Understanding of Exercise Prescription Yes       Intervention Provide education, explanation, and written materials on patient's individual exercise prescription       Expected Outcomes Short Term: Able to explain program exercise prescription;Long Term: Able to explain home exercise prescription to exercise independently              Exercise Goals Re-Evaluation :   Discharge Exercise Prescription (Final Exercise Prescription Changes):   Nutrition:  Target Goals: Understanding of nutrition guidelines, daily intake of sodium <1586m, cholesterol <2080m calories 30% from fat and 7% or less from saturated fats, daily to have 5 or more servings of fruits and vegetables.  Biometrics:  Pre Biometrics - 01/05/20 1508      Pre Biometrics   Waist Circumference 42 inches    Hip Circumference 44 inches    Waist to Hip Ratio 0.95 %    Triceps Skinfold 28 mm    % Body Fat 41.2 %    Grip Strength 40 kg    Flexibility 14 in    Single Leg Stand 4.02 seconds            Nutrition Therapy Plan and Nutrition Goals:   Nutrition Assessments:   Nutrition Goals Re-Evaluation:   Nutrition Goals Re-Evaluation:   Nutrition Goals Discharge (Final Nutrition Goals Re-Evaluation):   Psychosocial: Target Goals: Acknowledge presence or absence of significant depression and/or stress, maximize coping skills, provide positive support system. Participant is able to verbalize types and ability  to use techniques and skills needed for reducing stress and depression.  Initial Review & Psychosocial Screening:  Initial Psych Review & Screening - 01/05/20 1609      Initial Review   Current issues with Current Stress Concerns    Source of Stress Concerns Family;Financial;Occupation;Chronic Illness    Comments Ms. Leask admits to current stressors including her health, recent loss of her husband unexpectedly 1 year ago, finances, and her job. 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 She attended grief counseling through [facility] which was very beneficial for her. She admits to having a good support system and denies further interventions related to stress at this time.      Family Dynamics   Good Support System? Yes    Concerns Recent loss of significant other      Barriers   Psychosocial barriers to participate in program The patient should benefit from training in stress management and relaxation.      Screening Interventions   Interventions Encouraged to exercise;Provide feedback about the scores to participant           Quality of Life Scores:  Quality of Life - 01/05/20 1602      Quality of Life   Select Quality of Life      Quality of Life Scores   Health/Function Pre 19.96 %    Socioeconomic Pre 26.64 %    Psych/Spiritual Pre 22.07 %    Family Pre 22.5 %    GLOBAL Pre 22.27 %          Scores of 19 and below usually indicate a poorer quality of life in these areas.  A difference of  2-3 points is a clinically meaningful difference.  A difference of 2-3 points in the total score of the Quality of Life Index has been associated with significant improvement in overall quality of life, self-image, physical symptoms, and general health in studies assessing change in quality of life.  PHQ-9: Recent Review Flowsheet Data    Depression screen [facility] 2/9 01/05/2020 04/26/2014 01/13/2014   Decreased Interest 0 0 0   Down, Depressed, Hopeless 1 0 0   PHQ - 2 Score 1 0 0     Interpretation of Total Score  Total Score Depression Severity:  1-4 = Minimal depression, 5-9 = Mild depression, 10-14 = Moderate depression, 15-19 = Moderately severe depression, 20-27 = Severe depression   Psychosocial Evaluation and Intervention:   Psychosocial Re-Evaluation:   Psychosocial Discharge (Final Psychosocial Re-Evaluation):   Vocational Rehabilitation: Provide vocational rehab assistance to qualifying candidates.    Vocational Rehab Evaluation & Intervention:  Vocational Rehab - 01/05/20 1512      Initial Vocational Rehab Evaluation & Intervention   Assessment shows need for Vocational Rehabilitation No           Education: Education Goals: Education classes will be provided on a weekly basis, covering required topics. Participant will state understanding/return demonstration of topics presented.  Learning Barriers/Preferences:  Learning Barriers/Preferences - 01/05/20 1603      Learning Barriers/Preferences   Learning Barriers Sight   Waers glasses   Learning Preferences None           Education Topics: Count Your Pulse:  -Group instruction provided by verbal instruction, demonstration, patient participation and written materials to support subject.  Instructors address importance of being able to find your pulse and how to count your pulse when at home without a heart monitor.  Patients get hands on experience counting  their pulse with staff help and individually.   Heart Attack, Angina, and Risk Factor Modification:  -Group instruction provided by verbal instruction, video, and written materials to support subject.  Instructors address signs and symptoms of angina and heart attacks.    Also discuss risk factors for heart disease and how to make changes to improve heart health risk factors.   Functional Fitness:  -Group instruction provided by verbal instruction, demonstration, patient participation, and written materials to support subject.  Instructors address safety measures for doing things around the house.  Discuss how to get up and down off the floor, how to pick things up properly, how to safely get out of a chair without assistance, and balance training.   Meditation and Mindfulness:  -Group instruction provided by verbal instruction, patient participation, and written materials to support subject.  Instructor addresses importance of mindfulness and meditation practice to help  reduce stress and improve awareness.  Instructor also leads participants through a meditation exercise.    Stretching for Flexibility and Mobility:  -Group instruction provided by verbal instruction, patient participation, and written materials to support subject.  Instructors lead participants through series of stretches that are designed to increase flexibility thus improving mobility.  These stretches are additional exercise for major muscle groups that are typically performed during regular warm up and cool down.   Hands Only CPR:  -Group verbal, video, and participation provides a basic overview of AHA guidelines for community CPR. Role-play of emergencies allow participants the opportunity to practice calling for help and chest compression technique with discussion of AED use.   Hypertension: -Group verbal and written instruction that provides a basic overview of hypertension including the most recent diagnostic guidelines, risk factor reduction with self-care instructions and medication management.    Nutrition I class: Heart Healthy Eating:  -Group instruction provided by PowerPoint slides, verbal discussion, and written materials to support subject matter. The instructor gives an explanation and review of the Therapeutic Lifestyle Changes diet recommendations, which includes a discussion on lipid goals, dietary fat, sodium, fiber, plant stanol/sterol esters, sugar, and the components of a well-balanced, healthy diet.   Nutrition II class: Lifestyle Skills:  -Group instruction provided by PowerPoint slides, verbal discussion, and written materials to support subject matter. The instructor gives an explanation and review of label reading, grocery shopping for heart health, heart healthy recipe modifications, and ways to make healthier choices when eating out.   Diabetes Question & Answer:  -Group instruction provided by PowerPoint slides, verbal discussion, and written materials to  support subject matter. The instructor gives an explanation and review of diabetes co-morbidities, pre- and post-prandial blood glucose goals, pre-exercise blood glucose goals, signs, symptoms, and treatment of hypoglycemia and hyperglycemia, and foot care basics.   Diabetes Blitz:  -Group instruction provided by PowerPoint slides, verbal discussion, and written materials to support subject matter. The instructor gives an explanation and review of the physiology behind type 1 and type 2 diabetes, diabetes medications and rational behind using different medications, pre- and post-prandial blood glucose recommendations and Hemoglobin A1c goals, diabetes diet, and exercise including blood glucose guidelines for exercising safely.    Portion Distortion:  -Group instruction provided by PowerPoint slides, verbal discussion, written materials, and food models to support subject matter. The instructor gives an explanation of serving size versus portion size, changes in portions sizes over the last 20 years, and what consists of a serving from each food group.   Stress Management:  -Group instruction provided by verbal instruction,  video, and written materials to support subject matter.  Instructors review role of stress in heart disease and how to cope with stress positively.     Exercising on Your Own:  -Group instruction provided by verbal instruction, power point, and written materials to support subject.  Instructors discuss benefits of exercise, components of exercise, frequency and intensity of exercise, and end points for exercise.  Also discuss use of nitroglycerin and activating EMS.  Review options of places to exercise outside of rehab.  Review guidelines for sex with heart disease.   Cardiac Drugs I:  -Group instruction provided by verbal instruction and written materials to support subject.  Instructor reviews cardiac drug classes: antiplatelets, anticoagulants, beta blockers, and statins.   Instructor discusses reasons, side effects, and lifestyle considerations for each drug class.   Cardiac Drugs II:  -Group instruction provided by verbal instruction and written materials to support subject.  Instructor reviews cardiac drug classes: angiotensin converting enzyme inhibitors (ACE-I), angiotensin II receptor blockers (ARBs), nitrates, and calcium channel blockers.  Instructor discusses reasons, side effects, and lifestyle considerations for each drug class.   Anatomy and Physiology of the Circulatory System:  Group verbal and written instruction and models provide basic cardiac anatomy and physiology, with the coronary electrical and arterial systems. Review of: AMI, Angina, Valve disease, Heart Failure, Peripheral Artery Disease, Cardiac Arrhythmia, Pacemakers, and the ICD.   Other Education:  -Group or individual verbal, written, or video instructions that support the educational goals of the cardiac rehab program.   Holiday Eating Survival Tips:  -Group instruction provided by PowerPoint slides, verbal discussion, and written materials to support subject matter. The instructor gives patients tips, tricks, and techniques to help them not only survive but enjoy the holidays despite the onslaught of food that accompanies the holidays.   Knowledge Questionnaire Score:  Knowledge Questionnaire Score - 01/05/20 1604      Knowledge Questionnaire Score   Pre Score 23/24           Core Components/Risk Factors/Patient Goals at Admission:  Personal Goals and Risk Factors at Admission - 01/05/20 1604      Core Components/Risk Factors/Patient Goals on Admission    Weight Management Yes;Weight Loss    Admit Weight 175 lb 7.8 oz (79.6 kg)    Expected Outcomes Short Term: Continue to assess and modify interventions until short term weight is achieved;Long Term: Adherence to nutrition and physical activity/exercise program aimed toward attainment of established weight goal;Weight  Maintenance: Understanding of the daily nutrition guidelines, which includes 25-35% calories from fat, 7% or less cal from saturated fats, less than 275m cholesterol, less than 1.5gm of sodium, & 5 or more servings of fruits and vegetables daily;Weight Loss: Understanding of general recommendations for a balanced deficit meal plan, which promotes 1-2 lb weight loss per week and includes a negative energy balance of [phone] kcal/d;Understanding recommendations for meals to include 15-35% energy as protein, 25-35% energy from fat, 35-60% energy from carbohydrates, less than 2014mof dietary cholesterol, 20-35 gm of total fiber daily;Understanding of distribution of calorie intake throughout the day with the consumption of 4-5 meals/snacks    Diabetes Yes    Intervention Provide education about signs/symptoms and action to take for hypo/hyperglycemia.;Provide education about proper nutrition, including hydration, and aerobic/resistive exercise prescription along with prescribed medications to achieve blood glucose in normal ranges: Fasting glucose 65-99 mg/dL    Expected Outcomes Short Term: Participant verbalizes understanding of the signs/symptoms and immediate care of hyper/hypoglycemia, proper foot care and  importance of medication, aerobic/resistive exercise and nutrition plan for blood glucose control.;Long Term: Attainment of HbA1C < 7%.    Hypertension Yes    Intervention Provide education on lifestyle modifcations including regular physical activity/exercise, weight management, moderate sodium restriction and increased consumption of fresh fruit, vegetables, and low fat dairy, alcohol moderation, and smoking cessation.;Monitor prescription use compliance.    Expected Outcomes Short Term: Continued assessment and intervention until BP is < 140/70m HG in hypertensive participants. < 130/883mHG in hypertensive participants with diabetes, heart failure or chronic kidney disease.;Long Term: Maintenance of  blood pressure at goal levels.    Lipids Yes    Intervention Provide education and support for participant on nutrition & aerobic/resistive exercise along with prescribed medications to achieve LDL <7049mHDL >35m60m  Expected Outcomes Short Term: Participant states understanding of desired cholesterol values and is compliant with medications prescribed. Participant is following exercise prescription and nutrition guidelines.;Long Term: Cholesterol controlled with medications as prescribed, with individualized exercise RX and with personalized nutrition plan. Value goals: LDL < 70mg88mL > 40 mg.    Stress Yes    Intervention Offer individual and/or small group education and counseling on adjustment to heart disease, stress management and health-related lifestyle change. Teach and support self-help strategies.;Refer participants experiencing significant psychosocial distress to appropriate mental health specialists for further evaluation and treatment. When possible, include family members and significant others in education/counseling sessions.    Expected Outcomes Short Term: Participant demonstrates changes in health-related behavior, relaxation and other stress management skills, ability to obtain effective social support, and compliance with psychotropic medications if prescribed.;Long Term: Emotional wellbeing is indicated by absence of clinically significant psychosocial distress or social isolation.           Core Components/Risk Factors/Patient Goals Review:    Core Components/Risk Factors/Patient Goals at Discharge (Final Review):    ITP Comments:  ITP Comments    Row Name 01/05/20 1504           ITP Comments Dr. TraciFransico Himical Director Cardiac Rehab [facility]         Comments: Patient attended orientation on 01/05/2020 to review rules and guidelines for program.  Completed 6 minute walk test, Intitial ITP, and exercise prescription.  VSS. Telemetry-NSR.   Asymptomatic. Safety measures and social distancing in place per CDC guidelines.

## 2020-01-08 LAB — HEPATIC FUNCTION PANEL
ALT: 51 IU/L — ABNORMAL HIGH (ref 0–32)
AST: 25 IU/L (ref 0–40)
Albumin: 4.1 g/dL (ref 3.8–4.8)
Alkaline Phosphatase: 126 IU/L — ABNORMAL HIGH (ref 48–121)
Bilirubin Total: 0.5 mg/dL (ref 0.0–1.2)
Bilirubin, Direct: 0.16 mg/dL (ref 0.00–0.40)
Total Protein: 6.1 g/dL (ref 6.0–8.5)

## 2020-01-08 LAB — LIPID PANEL
Chol/HDL Ratio: 3.6 ratio (ref 0.0–4.4)
Cholesterol, Total: 152 mg/dL (ref 100–199)
HDL: 42 mg/dL (ref >39)
LDL Chol Calc (NIH): 85 mg/dL (ref 0–99)
Triglycerides: 139 mg/dL (ref 0–149)
VLDL Cholesterol Cal: 25 mg/dL (ref 5–40)

## 2020-01-13 ENCOUNTER — Other Ambulatory Visit: Payer: Self-pay

## 2020-01-13 ENCOUNTER — Encounter (HOSPITAL_COMMUNITY)
Admission: RE | Admit: 2020-01-13 | Discharge: 2020-01-13 | Disposition: A | Discharge status: Home / Self Care | Payer: 59 | Source: Ambulatory Visit | Attending: Internal Medicine | Admitting: Internal Medicine

## 2020-01-13 DIAGNOSIS — I2111 ST elevation (STEMI) myocardial infarction involving right coronary artery: Secondary | ICD-10-CM | POA: Diagnosis not present

## 2020-01-13 LAB — GLUCOSE, CAPILLARY
Glucose-Capillary: 113 mg/dL — ABNORMAL HIGH (ref 70–99)
Glucose-Capillary: 106 mg/dL — ABNORMAL HIGH (ref 70–99)

## 2020-01-13 NOTE — Progress Notes (Signed)
Daily Session Note  Patient Details  Name: Sarah Ali MRN: [id number] Date of Birth: Feb 06, 1956 Referring Provider:     CARDIAC REHAB PHASE II ORIENTATION from 01/05/2020 in [facility]  Referring Provider End, Harrell Gave      Encounter Date: 01/13/2020  Check In:  Session Check In - 01/13/20 1515      Check-In   Supervising physician immediately available to respond to emergencies [facility] immediately available    Physician(s) Dr. Cathlean Sauer    Location [facility]    Staff Present Dorma Russell, MS,ACSM CEP, Exercise Physiologist;David Makemson, MS, EP-C, CCRP;Ovila Lepage, RN, Deland Pretty, MS, ACSM CEP, Exercise Physiologist;Lisa Ysidro Evert, RN    Virtual Visit No    Medication changes reported     No    Fall or balance concerns reported    No    Tobacco Cessation No Change    Warm-up and Cool-down Performed on first and last piece of equipment    Resistance Training Performed No    VAD Patient? No    PAD/SET Patient? No      Pain Assessment   Currently in Pain? No/denies    Multiple Pain Sites No           Capillary Blood Glucose: Results for orders placed or performed during the hospital encounter of 01/13/20 (from the past 24 hour(s))  Glucose, capillary     Status: Abnormal   Collection Time: 01/13/20  3:08 PM  Result Value Ref Range   Glucose-Capillary 113 (H) 70 - 99 mg/dL  Glucose, capillary     Status: Abnormal   Collection Time: 01/13/20  4:13 PM  Result Value Ref Range   Glucose-Capillary 106 (H) 70 - 99 mg/dL      Social History   Tobacco Use  Smoking Status Never Smoker  Smokeless Tobacco Never Used    Goals Met:  Exercise tolerated well No report of cardiac concerns or symptoms  Goals Unmet:  Not Applicable  Comments: Pt started cardiac rehab today.  Pt tolerated light exercise without difficulty. VSS, telemetry-Sinus Rhythm with an inverted t wave, asymptomatic.  Medication list  reconciled. Pt denies barriers to medicaiton compliance.  PSYCHOSOCIAL ASSESSMENT:  PHQ-1. Pt exhibits positive coping skills, hopeful outlook with supportive family. Sarah Ali lost her husband one year ago. Will continue to offer emotional support as neded.    Pt enjoys keeping up with her family.   Pt oriented to exercise equipment and routine.    Understanding verbalized.Sarah Pall, RN,BSN 01/14/2020 2:05 PM   Dr. Fransico Him is Medical Director for Cardiac Rehab at [facility].

## 2020-01-14 NOTE — Progress Notes (Signed)
Cardiac Individual Treatment Plan  Patient Details  Name: Sarah Ali MRN: [id number] Date of Birth: Apr 28, 1956 Referring Provider:     CARDIAC REHAB PHASE II ORIENTATION from 01/05/2020 in [place]  Referring Provider End, Harrell Gave      Initial Encounter Date:    CARDIAC REHAB PHASE II ORIENTATION from 01/05/2020 in [place]  Date 01/05/20      Visit Diagnosis: ST elevation myocardial infarction involving right coronary artery ([place])  Patient's Home Medications on Admission:  Current Outpatient Medications:  .  aspirin 81 MG tablet, Take 81 mg by mouth daily., Disp: , Rfl:  .  atorvastatin (LIPITOR) 80 MG tablet, Take 1 tablet (80 mg total) by mouth daily at 6 PM., Disp: 90 tablet, Rfl: 3 .  carvedilol (COREG) 6.25 MG tablet, Take 6.25 mg by mouth 2 (two) times daily., Disp: , Rfl:  .  Cholecalciferol (VITAMIN D) 2000 units tablet, Take 2,000 Units by mouth daily., Disp: , Rfl:  .  fexofenadine (ALLEGRA) 60 MG tablet, Take 60 mg by mouth daily as needed for allergies or rhinitis., Disp: , Rfl:  .  JANUVIA 100 MG tablet, Take 100 mg by mouth daily., Disp: , Rfl:  .  metFORMIN (GLUMETZA) 500 MG (MOD) 24 hr tablet, Take 1,000 mg by mouth 2 (two) times daily with a meal. , Disp: , Rfl:  .  Multiple Vitamins-Minerals (MULTIVITAMIN WITH MINERALS) tablet, Take 1 tablet by mouth daily., Disp: , Rfl:  .  nitroGLYCERIN (NITROSTAT) 0.4 MG SL tablet, Place 1 tablet (0.4 mg total) under the tongue every 5 (five) minutes as needed for chest pain., Disp: 25 tablet, Rfl: 12 .  olmesartan (BENICAR) 40 MG tablet, Take 40 mg by mouth daily., Disp: , Rfl:  .  prasugrel (EFFIENT) 10 MG TABS tablet, Take 1 tablet (10 mg total) by mouth daily., Disp: 90 tablet, Rfl: 3 .  vitamin E 400 UNIT capsule, Take 400 Units by mouth daily., Disp: , Rfl:   Past Medical History: Past Medical History:  Diagnosis Date  . Asthma    childhood  .  BRCA1 positive   . Bronchitis   . Diabetes mellitus without complication ([place])   . Eczema   . Family history of breast cancer   . Family history of ovarian cancer   . High cholesterol   . Hypertension   . Pneumonia     Tobacco Use: Social History   Tobacco Use  Smoking Status Never Smoker  Smokeless Tobacco Never Used    Labs: Recent Review Flowsheet Data    Labs for ITP Cardiac and Pulmonary Rehab Latest Ref Rng & Units 11/16/2019 01/08/2020   Cholestrol 100 - 199 mg/dL 186 152   LDLCALC 0 - 99 mg/dL 85 85   HDL >39 mg/dL 44 42   Trlycerides 0 - 149 mg/dL 285(H) 139   Hemoglobin A1c 4.8 - 5.6 % 6.8(H) -      Capillary Blood Glucose: Lab Results  Component Value Date   GLUCAP 106 (H) 01/13/2020   GLUCAP 113 (H) 01/13/2020   GLUCAP 163 (H) 11/18/2019   GLUCAP 154 (H) 11/18/2019   GLUCAP 146 (H) 11/17/2019     Exercise Target Goals: Exercise Program Goal: Individual exercise prescription set using results from initial 6 min walk test and THRR while considering  patient's activity barriers and safety.   Exercise Prescription Goal: Starting with aerobic activity 30 plus minutes a day, 3 days per week for initial  exercise prescription. Provide home exercise prescription and guidelines that participant acknowledges understanding prior to discharge.  Activity Barriers & Risk Stratification:  Activity Barriers & Cardiac Risk Stratification - 01/05/20 1542      Activity Barriers & Cardiac Risk Stratification   Activity Barriers Balance Concerns   Scored 4.02 seconds on balance test   Cardiac Risk Stratification High           6 Minute Walk:  6 Minute Walk    Row Name 01/05/20 1540         6 Minute Walk   Phase Initial     Distance 1702 feet     Walk Time 6 minutes     # of Rest Breaks 0     MPH 3.22     METS 4.21     RPE 11     Perceived Dyspnea  1     VO2 Peak 14.72     Symptoms Yes (comment)     Comments Mild SOB, RPD = 1     Resting HR 70 bpm      Resting BP 158/82     Resting Oxygen Saturation  96 %     Exercise Oxygen Saturation  during 6 min walk 97 %     Max Ex. HR 97 bpm     Max Ex. BP 162/86     2 Minute Post BP 140/72            Oxygen Initial Assessment:   Oxygen Re-Evaluation:   Oxygen Discharge (Final Oxygen Re-Evaluation):   Initial Exercise Prescription:  Initial Exercise Prescription - 01/05/20 1500      Date of Initial Exercise RX and Referring Provider   Date 01/05/20    Referring Provider End, Harrell Gave    Expected Discharge Date 03/04/20      Treadmill   MPH 2.5    Grade 0    Minutes 15    METs 2.91      NuStep   Level 2    SPM 75    Minutes 15    METs 2.2      Prescription Details   Frequency (times per week) 3    Duration Progress to 30 minutes of continuous aerobic without signs/symptoms of physical distress      Intensity   THRR 40-80% of Max Heartrate 64-127    Ratings of Perceived Exertion 11-13    Perceived Dyspnea 0-4      Progression   Progression Continue progressive overload as per policy without signs/symptoms or physical distress.      Resistance Training   Training Prescription Yes    Weight 3 lbs    Reps 10-15           Perform Capillary Blood Glucose checks as needed.  Exercise Prescription Changes:   Exercise Prescription Changes    Row Name 01/13/20 1630             Response to Exercise   Blood Pressure (Admit) 140/86       Blood Pressure (Exercise) 154/80       Blood Pressure (Exit) 140/80       Heart Rate (Admit) 83 bpm       Heart Rate (Exercise) 99 bpm       Heart Rate (Exit) 75 bpm       Rating of Perceived Exertion (Exercise) 12       Symptoms None       Comments Pt's first day of exercise in  the CRP2 program.       Duration Progress to 30 minutes of  aerobic without signs/symptoms of physical distress       Intensity THRR unchanged         Progression   Progression Continue to progress workloads to maintain intensity without  signs/symptoms of physical distress.       Average METs 2.7         Resistance Training   Training Prescription No       Weight --  No free weights on Wednesday. Will use 3 lb wts. M, F         Interval Training   Interval Training No         Treadmill   MPH 2.5       Grade 0       Minutes 15       METs 2.91         NuStep   Level 2       SPM 85       Minutes 15       METs 2.4              Exercise Comments:   Exercise Comments    Row Name 01/13/20 1632           Exercise Comments Pt's first day of exercise the CRP2 program. Pt tolerated exercise session well with no complints. Will continue to monitor patient and progress as tolerated.              Exercise Goals and Review:   Exercise Goals    Row Name 01/05/20 1545             Exercise Goals   Increase Physical Activity Yes       Intervention Provide advice, education, support and counseling about physical activity/exercise needs.;Develop an individualized exercise prescription for aerobic and resistive training based on initial evaluation findings, risk stratification, comorbidities and participant's personal goals.       Expected Outcomes Short Term: Attend rehab on a regular basis to increase amount of physical activity.;Long Term: Add in home exercise to make exercise part of routine and to increase amount of physical activity.;Long Term: Exercising regularly at least 3-5 days a week.       Increase Strength and Stamina Yes       Intervention Provide advice, education, support and counseling about physical activity/exercise needs.;Develop an individualized exercise prescription for aerobic and resistive training based on initial evaluation findings, risk stratification, comorbidities and participant's personal goals.       Expected Outcomes Short Term: Increase workloads from initial exercise prescription for resistance, speed, and METs.;Short Term: Perform resistance training exercises routinely during  rehab and add in resistance training at home;Long Term: Improve cardiorespiratory fitness, muscular endurance and strength as measured by increased METs and functional capacity (6MWT)       Able to understand and use rate of perceived exertion (RPE) scale Yes       Intervention Provide education and explanation on how to use RPE scale       Expected Outcomes Short Term: Able to use RPE daily in rehab to express subjective intensity level;Long Term:  Able to use RPE to guide intensity level when exercising independently       Knowledge and understanding of Target Heart Rate Range (THRR) Yes       Intervention Provide education and explanation of THRR including how the numbers were predicted and where they are  located for reference       Expected Outcomes Short Term: Able to state/look up THRR;Short Term: Able to use daily as guideline for intensity in rehab;Long Term: Able to use THRR to govern intensity when exercising independently       Able to check pulse independently Yes       Intervention Provide education and demonstration on how to check pulse in carotid and radial arteries.;Review the importance of being able to check your own pulse for safety during independent exercise       Expected Outcomes Short Term: Able to explain why pulse checking is important during independent exercise;Long Term: Able to check pulse independently and accurately       Understanding of Exercise Prescription Yes       Intervention Provide education, explanation, and written materials on patient's individual exercise prescription       Expected Outcomes Short Term: Able to explain program exercise prescription;Long Term: Able to explain home exercise prescription to exercise independently              Exercise Goals Re-Evaluation :  Exercise Goals Re-Evaluation    Row Name 01/13/20 1630             Exercise Goal Re-Evaluation   Exercise Goals Review Increase Physical Activity;Increase Strength and  Stamina;Able to understand and use rate of perceived exertion (RPE) scale;Knowledge and understanding of Target Heart Rate Range (THRR);Able to check pulse independently;Understanding of Exercise Prescription       Comments Pt's first day in the CRP2 program. Pt tolerated exercise session well with no compalints. Pt understands Exercise RX, RPE scale and THRR.       Expected Outcomes Will continue to monitor and progress patient as tolerated.               Discharge Exercise Prescription (Final Exercise Prescription Changes):  Exercise Prescription Changes - 01/13/20 1630      Response to Exercise   Blood Pressure (Admit) 140/86    Blood Pressure (Exercise) 154/80    Blood Pressure (Exit) 140/80    Heart Rate (Admit) 83 bpm    Heart Rate (Exercise) 99 bpm    Heart Rate (Exit) 75 bpm    Rating of Perceived Exertion (Exercise) 12    Symptoms None    Comments Pt's first day of exercise in the CRP2 program.    Duration Progress to 30 minutes of  aerobic without signs/symptoms of physical distress    Intensity THRR unchanged      Progression   Progression Continue to progress workloads to maintain intensity without signs/symptoms of physical distress.    Average METs 2.7      Resistance Training   Training Prescription No    Weight --   No free weights on Wednesday. Will use 3 lb wts. M, F     Interval Training   Interval Training No      Treadmill   MPH 2.5    Grade 0    Minutes 15    METs 2.91      NuStep   Level 2    SPM 85    Minutes 15    METs 2.4           Nutrition:  Target Goals: Understanding of nutrition guidelines, daily intake of sodium <1528m, cholesterol <2027m calories 30% from fat and 7% or less from saturated fats, daily to have 5 or more servings of fruits and vegetables.  Biometrics:  Pre Biometrics -  01/05/20 1508      Pre Biometrics   Waist Circumference 42 inches    Hip Circumference 44 inches    Waist to Hip Ratio 0.95 %    Triceps  Skinfold 28 mm    % Body Fat 41.2 %    Grip Strength 40 kg    Flexibility 14 in    Single Leg Stand 4.02 seconds            Nutrition Therapy Plan and Nutrition Goals:   Nutrition Assessments:   Nutrition Goals Re-Evaluation:   Nutrition Goals Discharge (Final Nutrition Goals Re-Evaluation):   Psychosocial: Target Goals: Acknowledge presence or absence of significant depression and/or stress, maximize coping skills, provide positive support system. Participant is able to verbalize types and ability to use techniques and skills needed for reducing stress and depression.  Initial Review & Psychosocial Screening:  Initial Psych Review & Screening - 01/05/20 1609      Initial Review   Current issues with Current Stress Concerns    Source of Stress Concerns Family;Financial;Occupation;Chronic Illness    Comments Ms. Jasmer admits to current stressors including her health, recent loss of her husband unexpectedly 1 year ago, finances, and her job. 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 She admits to having a good support system and denies further interventions related to stress at this time.      Family Dynamics   Good Support System? Yes    Concerns Recent loss of significant other      Barriers   Psychosocial barriers to participate in program The patient should benefit from training in stress management and relaxation.      Screening Interventions   Interventions Encouraged to exercise;Provide feedback about the scores to participant           Quality of Life Scores:  Quality of Life - 01/05/20 1602      Quality of Life   Select Quality of Life      Quality of Life Scores   Health/Function Pre 19.96 %    Socioeconomic Pre 26.64 %    Psych/Spiritual Pre 22.07 %    Family Pre 22.5 %    GLOBAL Pre 22.27 %          Scores of 19 and below usually indicate a  poorer quality of life in these areas.  A difference of  2-3 points is a clinically meaningful difference.  A difference of 2-3 points in the total score of the Quality of Life Index has been associated with significant improvement in overall quality of life, self-image, physical symptoms, and general health in studies assessing change in quality of life.  PHQ-9: Recent Review Flowsheet Data    Depression screen [facility] 2/9 01/05/2020 04/26/2014 01/13/2014   Decreased Interest 0 0 0   Down, Depressed, Hopeless 1 0 0   PHQ - 2 Score 1 0 0     Interpretation of Total Score  Total Score Depression Severity:  1-4 = Minimal depression, 5-9 = Mild depression, 10-14 = Moderate depression, 15-19 = Moderately severe depression, 20-27 = Severe depression   Psychosocial Evaluation and Intervention:   Psychosocial Re-Evaluation:  Psychosocial Re-Evaluation    Dent Name 01/14/20 1406             Psychosocial Re-Evaluation   Current issues with Current Stress Concerns       Comments Breezy is still mourning the loss  of her husband one year ago. Patie t does have her 3 sons for support       Expected Outcomes Will continue to offer emotional support as needed       Interventions Encouraged to attend Cardiac Rehabilitation for the exercise;Stress management education       Continue Psychosocial Services  Follow up required by staff         Initial Review   Source of Stress Concerns Family              Psychosocial Discharge (Final Psychosocial Re-Evaluation):  Psychosocial Re-Evaluation - 01/14/20 1406      Psychosocial Re-Evaluation   Current issues with Current Stress Concerns    Comments Kizzy is still mourning the loss of her husband one year ago. Patie t does have her 3 sons for support    Expected Outcomes Will continue to offer emotional support as needed    Interventions Encouraged to attend Cardiac Rehabilitation for the exercise;Stress management education    Continue Psychosocial  Services  Follow up required by staff      Initial Review   Source of Stress Concerns Family           Vocational Rehabilitation: Provide vocational rehab assistance to qualifying candidates.   Vocational Rehab Evaluation & Intervention:  Vocational Rehab - 01/05/20 1512      Initial Vocational Rehab Evaluation & Intervention   Assessment shows need for Vocational Rehabilitation No           Education: Education Goals: Education classes will be provided on a weekly basis, covering required topics. Participant will state understanding/return demonstration of topics presented.  Learning Barriers/Preferences:  Learning Barriers/Preferences - 01/05/20 1603      Learning Barriers/Preferences   Learning Barriers Sight   Waers glasses   Learning Preferences None           Education Topics: Hypertension, Hypertension Reduction -Define heart disease and high blood pressure. Discus how high blood pressure affects the body and ways to reduce high blood pressure.   Exercise and Your Heart -Discuss why it is important to exercise, the FITT principles of exercise, normal and abnormal responses to exercise, and how to exercise safely.   Angina -Discuss definition of angina, causes of angina, treatment of angina, and how to decrease risk of having angina.   Cardiac Medications -Review what the following cardiac medications are used for, how they affect the body, and side effects that may occur when taking the medications.  Medications include Aspirin, Beta blockers, calcium channel blockers, ACE Inhibitors, angiotensin receptor blockers, diuretics, digoxin, and antihyperlipidemics.   Congestive Heart Failure -Discuss the definition of CHF, how to live with CHF, the signs and symptoms of CHF, and how keep track of weight and sodium intake.   Heart Disease and Intimacy -Discus the effect sexual activity has on the heart, how changes occur during intimacy as we age, and safety  during sexual activity.   Smoking Cessation / COPD -Discuss different methods to quit smoking, the health benefits of quitting smoking, and the definition of COPD.   Nutrition I: Fats -Discuss the types of cholesterol, what cholesterol does to the heart, and how cholesterol levels can be controlled.   Nutrition II: Labels -Discuss the different components of food labels and how to read food label   Heart Parts/Heart Disease and PAD -Discuss the anatomy of the heart, the pathway of blood circulation through the heart, and these are affected by heart disease.   Stress  I: Signs and Symptoms -Discuss the causes of stress, how stress may lead to anxiety and depression, and ways to limit stress.   Stress II: Relaxation -Discuss different types of relaxation techniques to limit stress.   Warning Signs of Stroke / TIA -Discuss definition of a stroke, what the signs and symptoms are of a stroke, and how to identify when someone is having stroke.   Knowledge Questionnaire Score:  Knowledge Questionnaire Score - 01/05/20 1604      Knowledge Questionnaire Score   Pre Score 23/24           Core Components/Risk Factors/Patient Goals at Admission:  Personal Goals and Risk Factors at Admission - 01/05/20 1604      Core Components/Risk Factors/Patient Goals on Admission    Weight Management Yes;Weight Loss    Admit Weight 175 lb 7.8 oz (79.6 kg)    Expected Outcomes Short Term: Continue to assess and modify interventions until short term weight is achieved;Long Term: Adherence to nutrition and physical activity/exercise program aimed toward attainment of established weight goal;Weight Maintenance: Understanding of the daily nutrition guidelines, which includes 25-35% calories from fat, 7% or less cal from saturated fats, less than 266m cholesterol, less than 1.5gm of sodium, & 5 or more servings of fruits and vegetables daily;Weight Loss: Understanding of general recommendations for a  balanced deficit meal plan, which promotes 1-2 lb weight loss per week and includes a negative energy balance of [phone] kcal/d;Understanding recommendations for meals to include 15-35% energy as protein, 25-35% energy from fat, 35-60% energy from carbohydrates, less than 2024mof dietary cholesterol, 20-35 gm of total fiber daily;Understanding of distribution of calorie intake throughout the day with the consumption of 4-5 meals/snacks    Diabetes Yes    Intervention Provide education about signs/symptoms and action to take for hypo/hyperglycemia.;Provide education about proper nutrition, including hydration, and aerobic/resistive exercise prescription along with prescribed medications to achieve blood glucose in normal ranges: Fasting glucose 65-99 mg/dL    Expected Outcomes Short Term: Participant verbalizes understanding of the signs/symptoms and immediate care of hyper/hypoglycemia, proper foot care and importance of medication, aerobic/resistive exercise and nutrition plan for blood glucose control.;Long Term: Attainment of HbA1C < 7%.    Hypertension Yes    Intervention Provide education on lifestyle modifcations including regular physical activity/exercise, weight management, moderate sodium restriction and increased consumption of fresh fruit, vegetables, and low fat dairy, alcohol moderation, and smoking cessation.;Monitor prescription use compliance.    Expected Outcomes Short Term: Continued assessment and intervention until BP is < 140/9027mG in hypertensive participants. < 130/32m42m in hypertensive participants with diabetes, heart failure or chronic kidney disease.;Long Term: Maintenance of blood pressure at goal levels.    Lipids Yes    Intervention Provide education and support for participant on nutrition & aerobic/resistive exercise along with prescribed medications to achieve LDL <70mg15mL >40mg.51mExpected Outcomes Short Term: Participant states understanding of desired  cholesterol values and is compliant with medications prescribed. Participant is following exercise prescription and nutrition guidelines.;Long Term: Cholesterol controlled with medications as prescribed, with individualized exercise RX and with personalized nutrition plan. Value goals: LDL < 70mg, 29m> 40 mg.    Stress Yes    Intervention Offer individual and/or small group education and counseling on adjustment to heart disease, stress management and health-related lifestyle change. Teach and support self-help strategies.;Refer participants experiencing significant psychosocial distress to appropriate mental health specialists for further evaluation and treatment. When possible, include family members and significant  others in education/counseling sessions.    Expected Outcomes Short Term: Participant demonstrates changes in health-related behavior, relaxation and other stress management skills, ability to obtain effective social support, and compliance with psychotropic medications if prescribed.;Long Term: Emotional wellbeing is indicated by absence of clinically significant psychosocial distress or social isolation.           Core Components/Risk Factors/Patient Goals Review:   Goals and Risk Factor Review    Row Name 01/14/20 1408             Core Components/Risk Factors/Patient Goals Review   Personal Goals Review Weight Management/Obesity;Stress;Hypertension;Diabetes;Lipids       Review Aryannah started exercise on 01/14/20 and did well with exercise. Sweta vital signs and CBG's were stable       Expected Outcomes Patient will continue to participate in phase 2 cardiac rehab for exercise, nutrition and lifstyle modifications              Core Components/Risk Factors/Patient Goals at Discharge (Final Review):   Goals and Risk Factor Review - 01/14/20 1408      Core Components/Risk Factors/Patient Goals Review   Personal Goals Review Weight  Management/Obesity;Stress;Hypertension;Diabetes;Lipids    Review Jordain started exercise on 01/14/20 and did well with exercise. Whitnie vital signs and CBG's were stable    Expected Outcomes Patient will continue to participate in phase 2 cardiac rehab for exercise, nutrition and lifstyle modifications           ITP Comments:  ITP Comments    Row Name 01/05/20 1504 01/14/20 1405         ITP Comments Dr. Fransico Him, Medical Director Cardiac Rehab [place] 30 Day ITP Review. Leola started exercise on 01/14/20 and did well with exercise             Comments: See ITP comments.Barnet Pall, RN,BSN 01/14/2020 2:11 PM

## 2020-01-14 NOTE — Progress Notes (Signed)
Cardiology Office Note:   Date:  01/15/2020  NAME:  Sarah Ali    MRN: [id number] DOB:  [birth year]   PCP:  Merrilee Seashore, MD  Cardiologist:  Evalina Field, MD  Electrophysiologist:  None   Referring MD: Merrilee Seashore, MD   Chief Complaint  Patient presents with  . Coronary Artery Disease   History of Present Illness:   Sarah Ali is a 64 y.o. female with a hx of HTN, HLD, DM, CAD who presents for follow-up of CAD.  She reports she is doing well.  She denies any chest pain or shortness of breath.  She is not exercising but plans to start cardiac rehab possibly today.  She is tolerating her aspirin and Effient well.  No major bleeding episodes reported.  Her blood pressure is 150/84 today.  She is on Coreg 6.25 and Benicar 40 mg daily.  We do need to increase her Coreg.  She recently had a cholesterol level done which demonstrates a total cholesterol 152 and LDL of 85.  LDL still not at goal despite high intensity statin.  We did discuss the possibility of Zetia versus a PCSK9 inhibitor.  She likes the idea of medications versus injectables.  Overall she seems to be doing well we are just trying to optimize her secondary preventive measures.  Problem List 1. STEMI -11/2019 -DES to d RCA (occluded old stent) -60% mid LAD -40% LCX -post infarct pericarditis 2. DM -A1c 6.8 3. HLD -T chol 152, HDL 42, LDL 85, TG 129   Past Medical History: Past Medical History:  Diagnosis Date  . Asthma    childhood  . BRCA1 positive   . Bronchitis   . Diabetes mellitus without complication ([place])   . Eczema   . Family history of breast cancer   . Family history of ovarian cancer   . High cholesterol   . Hypertension   . Pneumonia     Past Surgical History: Past Surgical History:  Procedure Laterality Date  . BREAST BIOPSY    . BREAST SURGERY  07/05/2016   biopsy  . COLONOSCOPY    . CORONARY ANGIOPLASTY  2015  . CORONARY/GRAFT ACUTE MI REVASCULARIZATION N/A 11/16/2019    Procedure: Coronary/Graft Acute MI Revascularization;  Surgeon: Nelva Bush, MD;  Location: [facility];  Service: Cardiovascular;  Laterality: N/A;  . DILATATION & CURRETTAGE/HYSTEROSCOPY WITH RESECTOCOPE N/A 07/19/2016   Procedure: DILATATION & CURETTAGE/HYSTEROSCOPY WITH RESECTION OF ENDOMETRIAL POLYP WITH MYOSURE;  Surgeon: Servando Salina, MD;  Location: [facility];  Service: Gynecology;  Laterality: N/A;  . LEFT HEART CATH AND CORONARY ANGIOGRAPHY N/A 11/16/2019   Procedure: LEFT HEART CATH AND CORONARY ANGIOGRAPHY;  Surgeon: Nelva Bush, MD;  Location: [facility];  Service: Cardiovascular;  Laterality: N/A;  . ROBOTIC ASSISTED BILATERAL SALPINGO OOPHERECTOMY Bilateral 07/19/2016   Procedure: ROBOTIC ASSISTED BILATERAL SALPINGO OOPHORECTOMY AND LYSIS OF ADHESIONS, PELVIC WASHINGS;  Surgeon: Servando Salina, MD;  Location: [facility];  Service: Gynecology;  Laterality: Bilateral;  DO NOT DRAPE THE ROBOT     Current Medications: Current Meds  Medication Sig  . amLODipine (NORVASC) 5 MG tablet Take 5 mg by mouth daily.  Marland Kitchen atorvastatin (LIPITOR) 80 MG tablet Take 1 tablet (80 mg total) by mouth daily at 6 PM.  . carvedilol (COREG) 12.5 MG tablet Take 1 tablet (12.5 mg total) by mouth 2 (two) times daily.  . Cholecalciferol (VITAMIN D) 2000 units tablet Take 2,000 Units by mouth daily.  . fexofenadine (ALLEGRA) 60  MG tablet Take 60 mg by mouth daily as needed for allergies or rhinitis.  Marland Kitchen JANUVIA 100 MG tablet Take 100 mg by mouth daily.  . metFORMIN (GLUCOPHAGE-XR) 500 MG 24 hr tablet Take 1,000 mg by mouth 2 (two) times daily.  . Multiple Vitamins-Minerals (MULTIVITAMIN WITH MINERALS) tablet Take 1 tablet by mouth daily.  . nitroGLYCERIN (NITROSTAT) 0.4 MG SL tablet Place 1 tablet (0.4 mg total) under the tongue every 5 (five) minutes as needed for chest pain.  Marland Kitchen olmesartan (BENICAR) 40 MG tablet Take 40 mg by mouth daily.  . prasugrel (EFFIENT) 10 MG TABS tablet Take 1  tablet (10 mg total) by mouth daily.  . vitamin E 400 UNIT capsule Take 400 Units by mouth daily.  . [DISCONTINUED] aspirin 81 MG tablet Take 81 mg by mouth daily.  . [DISCONTINUED] carvedilol (COREG) 6.25 MG tablet Take 6.25 mg by mouth 2 (two) times daily.  . [DISCONTINUED] nitroGLYCERIN (NITROSTAT) 0.4 MG SL tablet Place 1 tablet (0.4 mg total) under the tongue every 5 (five) minutes as needed for chest pain.  . [DISCONTINUED] prasugrel (EFFIENT) 10 MG TABS tablet Take 1 tablet (10 mg total) by mouth daily.     Allergies:    Patient has no known allergies.   Social History: Social History   Socioeconomic History  . Marital status: Widowed    Spouse name: Not on file  . Number of children: 3  . Years of education: Not on file  . Highest education level: Not on file  Occupational History  . Not on file  Tobacco Use  . Smoking status: Never Smoker  . Smokeless tobacco: Never Used  Substance and Sexual Activity  . Alcohol use: Yes    Comment: occ  . Drug use: No  . Sexual activity: Not on file  Other Topics Concern  . Not on file  Social History Narrative  . Not on file   Social Determinants of Health   Financial Resource Strain:   . Difficulty of Paying Living Expenses: Not on file  Food Insecurity:   . Worried About Charity fundraiser in the Last Year: Not on file  . Ran Out of Food in the Last Year: Not on file  Transportation Needs:   . Lack of Transportation (Medical): Not on file  . Lack of Transportation (Non-Medical): Not on file  Physical Activity:   . Days of Exercise per Week: Not on file  . Minutes of Exercise per Session: Not on file  Stress:   . Feeling of Stress : Not on file  Social Connections:   . Frequency of Communication with Friends and Family: Not on file  . Frequency of Social Gatherings with Friends and Family: Not on file  . Attends Religious Services: Not on file  . Active Member of Clubs or Organizations: Not on file  . Attends English as a second language teacher Meetings: Not on file  . Marital Status: Not on file    Family History: The patient's family history includes BRCA 1/2 in her brother and maternal aunt; Breast cancer in her maternal aunt and maternal grandmother; Colon cancer in her paternal aunt; Colon cancer (age of onset: 28) in her mother; Ovarian cancer (age of onset: 1) in her mother; Parkinson's disease (age of onset: 42) in her maternal grandfather; Skin cancer in her father.  ROS:   All other ROS reviewed and negative. Pertinent positives noted in the HPI.     EKGs/Labs/Other Studies Reviewed:   The following  studies were personally reviewed by me today:  TTE 11/16/2019 1. Normal LV function; grade 1 diastolic dysfunction; mild LVH; small  pericardial effusion.  2. Left ventricular ejection fraction, by estimation, is 55 to 60%. The  left ventricle has normal function. The left ventricle has no regional  wall motion abnormalities. There is mild left ventricular hypertrophy.  Left ventricular diastolic parameters  are consistent with Grade I diastolic dysfunction (impaired relaxation).  3. Right ventricular systolic function is normal. The right ventricular  size is normal.  4. The mitral valve is normal in structure. No evidence of mitral valve  regurgitation. No evidence of mitral stenosis.  5. The aortic valve is tricuspid. Aortic valve regurgitation is not  visualized. No aortic stenosis is present.  6. The inferior vena cava is normal in size with greater than 50%  respiratory variability, suggesting right atrial pressure of 3 mmHg.   LHC 11/16/2019 1. Severe single-vessel coronary artery disease with thrombotic occlusion of the distal RCA involving the distal segment of old stent as well as unstented vessel beyond the stent.  There is also 80% stenosis involving the ostium of the RPDA. 2. Moderate, noncritical disease involving the mid LAD, mid LCx, and proximal RCA of up to 50-60%. 3. Low normal  left ventricular contraction with basal and mid inferior hypokinesis.  LVEF 50-55%. 4. Mildly elevated left ventricular filling pressure. 5. Successful PCI to distal RCA using a Synergy 2.5 x 24 mm drug-eluting stent extending from the distal half of the old stent to just before the bifurcation (postdilated to 2.9 mm) with 0% residual stenosis and TIMI-3 flow. 6. Successful PTCA to the ostium of RCA using Emerge 2.0 x 12 mm balloon with reduction of stenosis from 80% to 20%.   Recent Labs: 11/18/2019: BUN 19; Creatinine, Ser 0.93; Hemoglobin 12.0; Platelets 146; Potassium 4.1; Sodium 139 01/08/2020: ALT 51   Recent Lipid Panel    Component Value Date/Time   CHOL 152 01/08/2020 0826   TRIG 139 01/08/2020 0826   HDL 42 01/08/2020 0826   CHOLHDL 3.6 01/08/2020 0826   CHOLHDL 4.2 11/16/2019 1102   VLDL 57 (H) 11/16/2019 1102   LDLCALC 85 01/08/2020 0826    Physical Exam:   VS:  BP (!) 150/84   Pulse 71   Ht _0  (1.676 m)   Wt 179 lb 9.6 oz (81.5 kg)   SpO2 99%   BMI 28.99 kg/m    Wt Readings from Last 3 Encounters:  01/15/20 179 lb 9.6 oz (81.5 kg)  01/05/20 175 lb 7.8 oz (79.6 kg)  12/08/19 179 lb (81.2 kg)    General: Well nourished, well developed, in no acute distress Heart: Atraumatic, normal size  Eyes: PEERLA, EOMI  Neck: Supple, no JVD Endocrine: No thryomegaly Cardiac: Normal S1, S2; RRR; no murmurs, rubs, or gallops Lungs: Clear to auscultation bilaterally, no wheezing, rhonchi or rales  Abd: Soft, nontender, no hepatomegaly  Ext: No edema, pulses 2+ Musculoskeletal: No deformities, BUE and BLE strength normal and equal Skin: Warm and dry, no rashes   Neuro: Alert and oriented to person, place, time, and situation, CNII-XII grossly intact, no focal deficits  Psych: Normal mood and affect   ASSESSMENT:   Sarah Ali is a 64 y.o. female who presents for the following: 1. Coronary artery disease involving native coronary artery of native heart without angina  pectoris   2. Mixed hyperlipidemia   3. Essential hypertension   4. 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 Coronary artery disease involving native coronary artery of native heart without angina pectoris -PCI to RCA in 2015.  She had recent inferior STEMI with thrombotic occlusion of the stent in July of this year.  Status post PCI.  Had POBA to the distal PDA branch.  No chest pain reported today. -We will refill her aspirin and Effient.  She will plan for 1 year of DAPT. -Most recent LDL cholesterol still not at goal despite 80 mg of Lipitor.  We discussed Zetia versus PCSK9 he would have.  We will proceed with 10 mg of Zetia.  I will see her back in 3 months and she will give [place] a repeat lipid profile 1 week before that appointment. -Blood pressure not at goal.  Increase Coreg to 12.5 mg twice daily.  Continue olmesartan 40 mg daily.  2. Mixed hyperlipidemia -Add Zetia.  Continue Lipitor 80 mg daily.  Repeat lipid profile in 3 months.  3. Essential hypertension -Increase Coreg 12.5 mg twice daily.  4. 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  Disposition: No follow-ups on file.  Medication Adjustments/Labs and Tests Ordered: Current medicines are reviewed at length with the patient today.  Concerns regarding medicines are outlined above.  Orders Placed This Encounter  Procedures  . Lipid panel   Meds ordered this encounter  Medications  . ezetimibe (ZETIA) 10 MG tablet    Sig: Take 1 tablet (10 mg total) by mouth daily.    Dispense:  90 tablet    Refill:  3  . prasugrel (EFFIENT) 10 MG TABS tablet    Sig: Take 1 tablet (10 mg total) by mouth daily.    Dispense:  90 tablet    Refill:  3  . nitroGLYCERIN (NITROSTAT) 0.4 MG SL tablet    Sig: Place 1 tablet (0.4 mg total) under the tongue every 5 (five) minutes as needed for chest pain.    Dispense:  25 tablet    Refill:  3  . aspirin EC 81 MG tablet    Sig: Take 1 tablet (81 mg total) by mouth daily.  Swallow whole.    Dispense:  90 tablet    Refill:  3  . carvedilol (COREG) 12.5 MG tablet    Sig: Take 1 tablet (12.5 mg total) by mouth 2 (two) times daily.    Dispense:  180 tablet    Refill:  3    Patient Instructions  Medication Instructions:  Increase Carvedilol to 12.5 mg twice daily  Start Zetia 10 mg daily  The current medical regimen is effective;  continue present plan and medications.  *If you need a refill on your cardiac medications before your next appointment, please call your pharmacy*   Lab Work: LIPID (1 week before follow up in 3 months, no lab appointment needed, come fasting- nothing to eat or drink)   If you have labs (blood work) drawn today and your tests are completely normal, you will receive your results only by: Marland Kitchen MyChart Message (if you have MyChart) OR . A paper copy in the mail If you have any lab test that is abnormal or we need to change your treatment, we will call you to review the results.  Follow-Up: At [facility], you and your health needs are our priority.  As part of our continuing mission to provide you with exceptional heart care, we have created designated Provider Care Teams.  These Care Teams include your primary Cardiologist (physician) and Advanced Practice Providers (APPs -  Physician Assistants and  Nurse Practitioners) who all work together to provide you with the care you need, when you need it.  We recommend signing up for the patient portal called "MyChart".  Sign up information is provided on this After Visit Summary.  MyChart is used to connect with patients for Virtual Visits (Telemedicine).  Patients are able to view lab/test results, encounter notes, upcoming appointments, etc.  Non-urgent messages can be sent to your provider as well.   To learn more about what you can do with MyChart, go to [email or web address].    Your next appointment:   3 month(s)  The format for your next appointment:   In Person  Provider:     Eleonore Chiquito, MD        Time Spent with Patient: I have spent a total of 35 minutes with patient reviewing hospital notes, telemetry, EKGs, labs and examining the patient as well as establishing an assessment and plan that was discussed with the patient.  > 50% of time was spent in direct patient care.  Signed, Addison Naegeli. Audie Box, [place]  [address], [place], [place] [address] [phone]  01/15/2020 2:52 PM

## 2020-01-15 ENCOUNTER — Encounter (HOSPITAL_COMMUNITY): Payer: 59

## 2020-01-15 ENCOUNTER — Ambulatory Visit (INDEPENDENT_AMBULATORY_CARE_PROVIDER_SITE_OTHER): Payer: 59 | Admitting: Cardiovascular Disease

## 2020-01-15 ENCOUNTER — Other Ambulatory Visit: Payer: Self-pay

## 2020-01-15 ENCOUNTER — Encounter: Payer: Self-pay | Admitting: Cardiovascular Disease

## 2020-01-15 VITALS — BP 150/84 | HR 71 | Ht 66.0 in | Wt 179.6 lb

## 2020-01-15 DIAGNOSIS — I241 Dressler's syndrome: Secondary | ICD-10-CM

## 2020-01-15 DIAGNOSIS — I10 Essential (primary) hypertension: Secondary | ICD-10-CM

## 2020-01-15 DIAGNOSIS — I2510 Atherosclerotic heart disease of native coronary artery without angina pectoris: Secondary | ICD-10-CM

## 2020-01-15 DIAGNOSIS — E782 Mixed hyperlipidemia: Secondary | ICD-10-CM

## 2020-01-15 MED ORDER — EZETIMIBE 10 MG PO TABS
10.0000 mg | ORAL_TABLET | Freq: Every day | ORAL | 3 refills | Status: DC
Start: 2020-01-15 — End: 2021-03-20

## 2020-01-15 MED ORDER — NITROGLYCERIN 0.4 MG SL SUBL: 0.4000 mg | SUBLINGUAL_TABLET | SUBLINGUAL | 3 refills | Status: DC | PRN

## 2020-01-15 MED ORDER — CARVEDILOL 12.5 MG PO TABS: 12.5000 mg | ORAL_TABLET | Freq: Two times a day (BID) | ORAL | 3 refills | Status: DC

## 2020-01-15 MED ORDER — ASPIRIN EC 81 MG PO TBEC: 81.0000 mg | DELAYED_RELEASE_TABLET | Freq: Every day | ORAL | 3 refills | Status: AC

## 2020-01-15 MED ORDER — PRASUGREL HCL 10 MG PO TABS: 10.0000 mg | ORAL_TABLET | Freq: Every day | ORAL | 3 refills | Status: DC

## 2020-01-15 NOTE — Patient Instructions (Signed)
Medication Instructions:  Increase Carvedilol to 12.5 mg twice daily  Start Zetia 10 mg daily  The current medical regimen is effective;  continue present plan and medications.  *If you need a refill on your cardiac medications before your next appointment, please call your pharmacy*   Lab Work: LIPID (1 week before follow up in 3 months, no lab appointment needed, come fasting- nothing to eat or drink)   If you have labs (blood work) drawn today and your tests are completely normal, you will receive your results only by:  [place] (if you have MyChart) OR  A paper copy in the mail If you have any lab test that is abnormal or we need to change your treatment, we will call you to review the results.  Follow-Up: At [facility], you and your health needs are our priority.  As part of our continuing mission to provide you with exceptional heart care, we have created designated Provider Care Teams.  These Care Teams include your primary Cardiologist (physician) and Advanced Practice Providers (APPs -  Physician Assistants and Nurse Practitioners) who all work together to provide you with the care you need, when you need it.  We recommend signing up for the patient portal called "MyChart".  Sign up information is provided on this After Visit Summary.  MyChart is used to connect with patients for Virtual Visits (Telemedicine).  Patients are able to view lab/test results, encounter notes, upcoming appointments, etc.  Non-urgent messages can be sent to your provider as well.   To learn more about what you can do with MyChart, go to [email or web address].    Your next appointment:   3 month(s)  The format for your next appointment:   In Person  Provider:   Eleonore Chiquito, MD

## 2020-01-18 ENCOUNTER — Encounter (HOSPITAL_COMMUNITY)
Admission: RE | Admit: 2020-01-18 | Discharge: 2020-01-18 | Disposition: A | Discharge status: Home / Self Care | Payer: 59 | Source: Ambulatory Visit | Attending: Internal Medicine | Admitting: Internal Medicine

## 2020-01-18 ENCOUNTER — Other Ambulatory Visit: Payer: Self-pay

## 2020-01-18 DIAGNOSIS — I2111 ST elevation (STEMI) myocardial infarction involving right coronary artery: Secondary | ICD-10-CM | POA: Diagnosis not present

## 2020-01-18 LAB — GLUCOSE, CAPILLARY
Glucose-Capillary: 111 mg/dL — ABNORMAL HIGH (ref 70–99)
Glucose-Capillary: 103 mg/dL — ABNORMAL HIGH (ref 70–99)

## 2020-01-19 NOTE — Progress Notes (Signed)
Forde Radon 64 y.o. female Nutrition Note  Diagnosis:  Past Medical History:  Diagnosis Date  . Asthma    childhood  . BRCA1 positive   . Bronchitis   . Diabetes mellitus without complication ([place])   . Eczema   . Family history of breast cancer   . Family history of ovarian cancer   . High cholesterol   . Hypertension   . Pneumonia      Medications reviewed.   Current Outpatient Medications:  .  amLODipine (NORVASC) 5 MG tablet, Take 5 mg by mouth daily., Disp: , Rfl:  .  aspirin EC 81 MG tablet, Take 1 tablet (81 mg total) by mouth daily. Swallow whole., Disp: 90 tablet, Rfl: 3 .  atorvastatin (LIPITOR) 80 MG tablet, Take 1 tablet (80 mg total) by mouth daily at 6 PM., Disp: 90 tablet, Rfl: 3 .  carvedilol (COREG) 12.5 MG tablet, Take 1 tablet (12.5 mg total) by mouth 2 (two) times daily., Disp: 180 tablet, Rfl: 3 .  Cholecalciferol (VITAMIN D) 2000 units tablet, Take 2,000 Units by mouth daily., Disp: , Rfl:  .  ezetimibe (ZETIA) 10 MG tablet, Take 1 tablet (10 mg total) by mouth daily., Disp: 90 tablet, Rfl: 3 .  fexofenadine (ALLEGRA) 60 MG tablet, Take 60 mg by mouth daily as needed for allergies or rhinitis., Disp: , Rfl:  .  JANUVIA 100 MG tablet, Take 100 mg by mouth daily., Disp: , Rfl:  .  metFORMIN (GLUCOPHAGE-XR) 500 MG 24 hr tablet, Take 1,000 mg by mouth 2 (two) times daily., Disp: , Rfl:  .  Multiple Vitamins-Minerals (MULTIVITAMIN WITH MINERALS) tablet, Take 1 tablet by mouth daily., Disp: , Rfl:  .  nitroGLYCERIN (NITROSTAT) 0.4 MG SL tablet, Place 1 tablet (0.4 mg total) under the tongue every 5 (five) minutes as needed for chest pain., Disp: 25 tablet, Rfl: 3 .  olmesartan (BENICAR) 40 MG tablet, Take 40 mg by mouth daily., Disp: , Rfl:  .  prasugrel (EFFIENT) 10 MG TABS tablet, Take 1 tablet (10 mg total) by mouth daily., Disp: 90 tablet, Rfl: 3 .  vitamin E 400 UNIT capsule, Take 400 Units by mouth daily., Disp: , Rfl:    Ht Readings from Last 1  Encounters:  01/15/20 _0  (1.676 m)     Wt Readings from Last 3 Encounters:  01/15/20 179 lb 9.6 oz (81.5 kg)  01/05/20 175 lb 7.8 oz (79.6 kg)  12/08/19 179 lb (81.2 kg)     There is no height or weight on file to calculate BMI.   Social History   Tobacco Use  Smoking Status Never Smoker  Smokeless Tobacco Never Used     Lab Results  Component Value Date   CHOL 152 01/08/2020   Lab Results  Component Value Date   HDL 42 01/08/2020   Lab Results  Component Value Date   LDLCALC 85 01/08/2020   Lab Results  Component Value Date   TRIG 139 01/08/2020     Lab Results  Component Value Date   HGBA1C 6.8 (H) 11/16/2019     CBG (last 3)  Recent Labs    01/18/20 1513 01/18/20 1603  GLUCAP 111* 103*     Nutrition Note  Spoke with pt. Nutrition Plan and Nutrition Survey goals reviewed with pt. Pt is following a Heart Healthy diet.   Pt has Type 2 Diabetes. Last A1c indicates blood glucose well-controlled. Pt checks CBG's 1 times a day. Fasting CBG's reportedly 100-130 mg/dL.  Per discussion, pt does use canned/convenience foods often. Pt does not add salt to food. Pt does not eat out frequently.  She wants to learn how to read labels.  She has a high stress job and not much time to [employer]. Her husband passed away last year and he was the primary cook in her home. She is learning how to incorporate healthy meals into her busy lifestyle on her own.   Pt expressed understanding of the information reviewed.   Nutrition Diagnosis ? Food-and nutrition-related knowledge deficit related to lack of exposure to information as related to diagnosis of: ? CVD ? Type 2 Diabetes   Nutrition Intervention ? Pt's individual nutrition plan reviewed with pt. ? Benefits of adopting Heart Healthy diet discussed when Medficts reviewed.   ? Continue client-centered nutrition education by RD, as part of interdisciplinary care.  Goal(s)  ? Pt to build a healthy plate including  vegetables, fruits, whole grains, and low-fat dairy products in a heart healthy meal plan. ? Pt to learn to read labels  ? Pt to prepare easy meals for on-the-go  Plan:   Will provide client-centered nutrition education as part of interdisciplinary care  Monitor and evaluate progress toward nutrition goal with team.   Michaele Offer, MS, RDN, LDN

## 2020-01-20 ENCOUNTER — Encounter (HOSPITAL_COMMUNITY)
Admission: RE | Admit: 2020-01-20 | Discharge: 2020-01-20 | Disposition: A | Discharge status: Home / Self Care | Payer: 59 | Source: Ambulatory Visit | Attending: Internal Medicine | Admitting: Internal Medicine

## 2020-01-20 ENCOUNTER — Other Ambulatory Visit: Payer: Self-pay

## 2020-01-20 ENCOUNTER — Telehealth: Payer: Self-pay

## 2020-01-20 DIAGNOSIS — Z0000 Encounter for general adult medical examination without abnormal findings: Secondary | ICD-10-CM

## 2020-01-20 DIAGNOSIS — I2111 ST elevation (STEMI) myocardial infarction involving right coronary artery: Secondary | ICD-10-CM

## 2020-01-20 NOTE — Telephone Encounter (Signed)
Called patient to inform them of their upcoming appt with [facility] at [facility] for nutritional education.  Patient is aware that the appt is scheduled for 10/14 @ 2pm. Patient understood that new patient paperwork will be mailed to her home to complete.

## 2020-01-21 NOTE — Progress Notes (Signed)
Nutrition Note - follow up  Spoke with pt. Reviewed label reading education.  Provided recommended values for sodium, saturated fat, carbohydrates, and fat.  Provided handout. Pt verbalized understanding. Will review carb counting at follow up.  Will continue to monitor pt during cardiac rehab.  Michaele Offer, MS, RDN, LDN

## 2020-01-22 ENCOUNTER — Encounter (HOSPITAL_COMMUNITY): Payer: 59

## 2020-01-25 ENCOUNTER — Encounter (HOSPITAL_COMMUNITY)
Admission: RE | Admit: 2020-01-25 | Discharge: 2020-01-25 | Disposition: A | Discharge status: Home / Self Care | Payer: 59 | Source: Ambulatory Visit | Attending: Internal Medicine | Admitting: Internal Medicine

## 2020-01-25 ENCOUNTER — Other Ambulatory Visit: Payer: Self-pay

## 2020-01-25 DIAGNOSIS — I2111 ST elevation (STEMI) myocardial infarction involving right coronary artery: Secondary | ICD-10-CM | POA: Diagnosis not present

## 2020-01-25 NOTE — Progress Notes (Signed)
Sarah Ali said that she would like more educational information on diet. Patient would like to download the virtual better hearts APP to have access to the educational content. Sarah Ali will continue with in person cardiac rehab.  .Pt is interested in participating in Virtual Cardiac and Pulmonary Rehab. Pt advised that Virtual Cardiac and Pulmonary Rehab is provided at no cost to the patient.  Checklist:  1. Pt has smart device  ie smartphone and/or ipad for downloading an app  Yes 2. Reliable internet/wifi service    Yes 3. Understands how to use their smartphone and navigate within an app.  Yes   Pt verbalized understanding and is in agreement.            Confirm Consent - In the setting of the current Covid19 crisis, you are scheduled for a phone visit with your Cardiac or Pulmonary team member.  Just as we do with many in-gym visits, in order for you to participate in this visit, we must obtain consent.  If you'd like, I can send this to your mychart (if signed up) or email for you to review.  Otherwise, I can obtain your verbal consent now.  By agreeing to a telephone visit, we'd like you to understand that the technology does not allow for your Cardiac or Pulmonary Rehab team member to perform a physical assessment, and thus may limit their ability to fully assess your ability to perform exercise programs. If your provider identifies any concerns that need to be evaluated in person, we will make arrangements to do so.  Finally, though the technology is pretty good, we cannot assure that it will always work on either your or our end and we cannot ensure that we have a secure connection.  Cardiac and Pulmonary Rehab Telehealth visits and "At Home" cardiac and pulmonary rehab are provided at no cost to you.        Are you willing to proceed?"        STAFF: Did the patient verbally acknowledge consent to telehealth visit? Document YES/NO here: Yes     Barnet Pall RN  Cardiac and Pulmonary Rehab  Staff        Date 01/25/20    @ Time 1636

## 2020-01-27 ENCOUNTER — Encounter (HOSPITAL_COMMUNITY): Payer: 59

## 2020-01-29 ENCOUNTER — Encounter (HOSPITAL_COMMUNITY)
Admission: RE | Admit: 2020-01-29 | Discharge: 2020-01-29 | Disposition: A | Discharge status: Home / Self Care | Payer: 59 | Source: Ambulatory Visit | Attending: Internal Medicine | Admitting: Internal Medicine

## 2020-01-29 ENCOUNTER — Ambulatory Visit (HOSPITAL_COMMUNITY)
Admission: RE | Admit: 2020-01-29 | Discharge: 2020-01-29 | Disposition: A | Discharge status: Home / Self Care | Payer: 59 | Source: Ambulatory Visit | Attending: Internal Medicine | Admitting: Internal Medicine

## 2020-01-29 ENCOUNTER — Telehealth: Payer: Self-pay | Admitting: Cardiology

## 2020-01-29 ENCOUNTER — Other Ambulatory Visit: Payer: Self-pay

## 2020-01-29 DIAGNOSIS — I2111 ST elevation (STEMI) myocardial infarction involving right coronary artery: Secondary | ICD-10-CM | POA: Diagnosis not present

## 2020-01-29 NOTE — Progress Notes (Signed)
Patient reports having some slight chest pressure earlier this morning as she did not sleep well last night. Patient reported having 2/10 chest discomfort on the treadmill and after sitting. Blood pressure 142/86 heart rate 74. Sarah Ali said that the pressure lasted about 5 minutes. 12 lead ECG obtained. Sarah Dama NP paged and notified. Sarah Ali to review 12 lead ECG and call back. Sarah Ali reviewed the 12 lead ECG. No new changes noted. Sarah Ali instructed the patient via telephone to self monitor for further symptoms, No new order received. Sarah Ali said that the patient is okay to return to exercise on Monday. Will continue to monitor the patient throughout  the program.Will continue to monitor the patient throughout the program. Exit BP 122/84.Upon review of patient's medications. Sarah Ali says that she has not been taking her amlodipine since her discharge from the hospital in July. Dr Debbe Mounts office notified. Sarah Ali had no symptoms or pressure upon exit from phase 2 cardiac rehab. Will fax Sarah Ali's exercise flow sheets to Dr Debbe Mounts office for review as the patient continues  to have some  Occasional exertional BP elevations  Barnet Pall, RN,BSN 01/29/2020 4:44 PM

## 2020-01-29 NOTE — Telephone Encounter (Signed)
Routed to MD to make aware, med list updated

## 2020-01-29 NOTE — Telephone Encounter (Signed)
New message   Sarah Ali from cardiac rehab called in and stated that pt  has not been taking her Amlodipine  since discharge in July.  Sarah Ali just wanted to report this to Dr Audie Box

## 2020-02-01 ENCOUNTER — Other Ambulatory Visit: Payer: Self-pay

## 2020-02-01 ENCOUNTER — Encounter (HOSPITAL_COMMUNITY)
Admission: RE | Admit: 2020-02-01 | Discharge: 2020-02-01 | Disposition: A | Discharge status: Home / Self Care | Payer: 59 | Source: Ambulatory Visit | Attending: Internal Medicine | Admitting: Internal Medicine

## 2020-02-01 DIAGNOSIS — I2111 ST elevation (STEMI) myocardial infarction involving right coronary artery: Secondary | ICD-10-CM | POA: Diagnosis not present

## 2020-02-02 ENCOUNTER — Telehealth (HOSPITAL_COMMUNITY): Payer: Self-pay | Admitting: Internal Medicine

## 2020-02-02 ENCOUNTER — Other Ambulatory Visit: Payer: Self-pay | Admitting: Obstetrics

## 2020-02-02 DIAGNOSIS — Z803 Family history of malignant neoplasm of breast: Secondary | ICD-10-CM

## 2020-02-02 DIAGNOSIS — C50919 Malignant neoplasm of unspecified site of unspecified female breast: Secondary | ICD-10-CM

## 2020-02-02 DIAGNOSIS — Z853 Personal history of malignant neoplasm of breast: Secondary | ICD-10-CM

## 2020-02-02 NOTE — Progress Notes (Signed)
Reviewed Home exercise program with patient today. Pt has a treadmill which no longer works so she is looking for one to replace it so she can use the treadmill at home. Importance of incorporating additional walking discussed.She is not currently walking at home but we discussed adding 2-3 days/week of walking for 30 minutes. Stressed to carry cell phone if walking outdoors. Discussed warm-up, cool-down and stretching.Hydration encouraged before, during and after exercise as well as parameters for weather in regards to temperature and humidity. THRR of [id number] reviewed. Reviewed S/S that would require patient to terminate exercise and when to call 911 vs MD. Encouraged to always carry NTG and reviewed proper use. Pt verbalized understanding of the home exercise prescription and was provided a copy.   Lesly Rubenstein MS, ACSM-EP-C, CCRP

## 2020-02-03 ENCOUNTER — Other Ambulatory Visit: Payer: Self-pay

## 2020-02-03 ENCOUNTER — Encounter (HOSPITAL_COMMUNITY)
Admission: RE | Admit: 2020-02-03 | Discharge: 2020-02-03 | Disposition: A | Discharge status: Home / Self Care | Payer: 59 | Source: Ambulatory Visit | Attending: Internal Medicine | Admitting: Internal Medicine

## 2020-02-03 ENCOUNTER — Telehealth (HOSPITAL_COMMUNITY): Payer: Self-pay

## 2020-02-03 DIAGNOSIS — I2111 ST elevation (STEMI) myocardial infarction involving right coronary artery: Secondary | ICD-10-CM | POA: Diagnosis not present

## 2020-02-03 NOTE — Telephone Encounter (Signed)
**  updated**  Pt insurance is active and benefits verified through [employer]. Co-pay $50.00, DED $0.00/$0.00 met, out of pocket $7,350.00/$0.00 met, co-insurance 0%. No pre-authorization required. Roberto/[employer], 02/03/20 @ 920AM, [phone]  Called and spoke with pt in regards to her updated [employer] plan. Adv pt she now has a $50.00 co-pay per session for CR, she verbalized understanding.

## 2020-02-04 NOTE — Progress Notes (Signed)
Cardiac Individual Treatment Plan  Patient Details  Name: Sarah Ali MRN: [id number] Date of Birth: 1956-01-29 Referring Provider:     CARDIAC REHAB PHASE II ORIENTATION from 01/05/2020 in [facility]  Referring Provider End, Harrell Gave      Initial Encounter Date:    CARDIAC REHAB PHASE II ORIENTATION from 01/05/2020 in [place]  Date 01/05/20      Visit Diagnosis: ST elevation myocardial infarction involving right coronary artery ([place])  Patient's Home Medications on Admission:  Current Outpatient Medications:  .  aspirin EC 81 MG tablet, Take 1 tablet (81 mg total) by mouth daily. Swallow whole., Disp: 90 tablet, Rfl: 3 .  atorvastatin (LIPITOR) 80 MG tablet, Take 1 tablet (80 mg total) by mouth daily at 6 PM., Disp: 90 tablet, Rfl: 3 .  carvedilol (COREG) 12.5 MG tablet, Take 1 tablet (12.5 mg total) by mouth 2 (two) times daily., Disp: 180 tablet, Rfl: 3 .  Cholecalciferol (VITAMIN D) 2000 units tablet, Take 2,000 Units by mouth daily., Disp: , Rfl:  .  ezetimibe (ZETIA) 10 MG tablet, Take 1 tablet (10 mg total) by mouth daily., Disp: 90 tablet, Rfl: 3 .  fexofenadine (ALLEGRA) 60 MG tablet, Take 60 mg by mouth daily as needed for allergies or rhinitis. (Patient not taking: Reported on 01/29/2020), Disp: , Rfl:  .  JANUVIA 100 MG tablet, Take 100 mg by mouth daily., Disp: , Rfl:  .  metFORMIN (GLUCOPHAGE-XR) 500 MG 24 hr tablet, Take 1,000 mg by mouth 2 (two) times daily., Disp: , Rfl:  .  Multiple Vitamins-Minerals (MULTIVITAMIN WITH MINERALS) tablet, Take 1 tablet by mouth daily., Disp: , Rfl:  .  nitroGLYCERIN (NITROSTAT) 0.4 MG SL tablet, Place 1 tablet (0.4 mg total) under the tongue every 5 (five) minutes as needed for chest pain., Disp: 25 tablet, Rfl: 3 .  olmesartan (BENICAR) 40 MG tablet, Take 40 mg by mouth daily., Disp: , Rfl:  .  prasugrel (EFFIENT) 10 MG TABS tablet, Take 1 tablet (10 mg total) by mouth  daily., Disp: 90 tablet, Rfl: 3 .  vitamin E 400 UNIT capsule, Take 400 Units by mouth daily., Disp: , Rfl:   Past Medical History: Past Medical History:  Diagnosis Date  . Asthma    childhood  . BRCA1 positive   . Bronchitis   . Diabetes mellitus without complication ([place])   . Eczema   . Family history of breast cancer   . Family history of ovarian cancer   . High cholesterol   . Hypertension   . Pneumonia     Tobacco Use: Social History   Tobacco Use  Smoking Status Never Smoker  Smokeless Tobacco Never Used    Labs: Recent Review Flowsheet Data    Labs for ITP Cardiac and Pulmonary Rehab Latest Ref Rng & Units 11/16/2019 01/08/2020   Cholestrol 100 - 199 mg/dL 186 152   LDLCALC 0 - 99 mg/dL 85 85   HDL >39 mg/dL 44 42   Trlycerides 0 - 149 mg/dL 285(H) 139   Hemoglobin A1c 4.8 - 5.6 % 6.8(H) -      Capillary Blood Glucose: Lab Results  Component Value Date   GLUCAP 103 (H) 01/18/2020   GLUCAP 111 (H) 01/18/2020   GLUCAP 106 (H) 01/13/2020   GLUCAP 113 (H) 01/13/2020   GLUCAP 163 (H) 11/18/2019     Exercise Target Goals: Exercise Program Goal: Individual exercise prescription set using results from initial 6  min walk test and THRR while considering  patient's activity barriers and safety.   Exercise Prescription Goal: Starting with aerobic activity 30 plus minutes a day, 3 days per week for initial exercise prescription. Provide home exercise prescription and guidelines that participant acknowledges understanding prior to discharge.  Activity Barriers & Risk Stratification:  Activity Barriers & Cardiac Risk Stratification - 01/05/20 1542      Activity Barriers & Cardiac Risk Stratification   Activity Barriers Balance Concerns   Scored 4.02 seconds on balance test   Cardiac Risk Stratification High           6 Minute Walk:  6 Minute Walk    Row Name 01/05/20 1540         6 Minute Walk   Phase Initial     Distance 1702 feet     Walk Time 6  minutes     # of Rest Breaks 0     MPH 3.22     METS 4.21     RPE 11     Perceived Dyspnea  1     VO2 Peak 14.72     Symptoms Yes (comment)     Comments Mild SOB, RPD = 1     Resting HR 70 bpm     Resting BP 158/82     Resting Oxygen Saturation  96 %     Exercise Oxygen Saturation  during 6 min walk 97 %     Max Ex. HR 97 bpm     Max Ex. BP 162/86     2 Minute Post BP 140/72            Oxygen Initial Assessment:   Oxygen Re-Evaluation:   Oxygen Discharge (Final Oxygen Re-Evaluation):   Initial Exercise Prescription:  Initial Exercise Prescription - 01/05/20 1500      Date of Initial Exercise RX and Referring Provider   Date 01/05/20    Referring Provider End, Harrell Gave    Expected Discharge Date 03/04/20      Treadmill   MPH 2.5    Grade 0    Minutes 15    METs 2.91      NuStep   Level 2    SPM 75    Minutes 15    METs 2.2      Prescription Details   Frequency (times per week) 3    Duration Progress to 30 minutes of continuous aerobic without signs/symptoms of physical distress      Intensity   THRR 40-80% of Max Heartrate 64-127    Ratings of Perceived Exertion 11-13    Perceived Dyspnea 0-4      Progression   Progression Continue progressive overload as per policy without signs/symptoms or physical distress.      Resistance Training   Training Prescription Yes    Weight 3 lbs    Reps 10-15           Perform Capillary Blood Glucose checks as needed.  Exercise Prescription Changes:  Exercise Prescription Changes    Row Name 01/13/20 1630 01/25/20 1635 01/29/20 1545         Response to Exercise   Blood Pressure (Admit) 140/86 120/82 156/70     Blood Pressure (Exercise) 154/80 136/84 148/76     Blood Pressure (Exit) 140/80 120/80 122/84     Heart Rate (Admit) 83 bpm 81 bpm 88 bpm     Heart Rate (Exercise) 99 bpm 99 bpm 100 bpm     Heart Rate (  Exit) 75 bpm 71 bpm 66 bpm     Rating of Perceived Exertion (Exercise) _0 Symptoms None None Chest Pressure on treadmill     Comments Pt's first day of exercise in the CRP2 program. Reviewed METs Session # 4 Reviewed Home exercise plan     Duration Progress to 30 minutes of  aerobic without signs/symptoms of physical distress Progress to 30 minutes of  aerobic without signs/symptoms of physical distress Continue with 30 min of aerobic exercise without signs/symptoms of physical distress.     Intensity THRR unchanged THRR unchanged THRR unchanged       Progression   Progression Continue to progress workloads to maintain intensity without signs/symptoms of physical distress. Continue to progress workloads to maintain intensity without signs/symptoms of physical distress. Continue to progress workloads to maintain intensity without signs/symptoms of physical distress.     Average METs 2.7 2.6 2.6       Resistance Training   Training Prescription No Yes Yes     Weight --  No free weights on Wednesday. Will use 3 lb wts. M, F 3 lbs 3 lbs     Reps -- 10-15 10-15     Time -- 10 Minutes 10 Minutes       Interval Training   Interval Training No No No       Treadmill   MPH 2.5 2.5 2.5     Grade 0 0 0     Minutes _1 METs 2.91 2.91 2.91       NuStep   Level _2 SPM 85 85 85     Minutes _3 METs 2.4 2.3 2.3       Home Exercise Plan   Plans to continue exercise at -- -- Home (comment)     Frequency -- -- Add 2 additional days to program exercise sessions.     Initial Home Exercises Provided -- -- 01/29/20            Exercise Comments:  Exercise Comments    Row Name 01/13/20 1632 01/25/20 1635 01/29/20 1622       Exercise Comments Pt's first day of exercise the CRP2 program. Pt tolerated exercise session well with no complints. Will continue to monitor patient and progress as tolerated. Reviewed METs today for patient progress. Reviewed home exercise program with patient today. Pt verbalized understanding of home exercise Rx and was  provided copy.            Exercise Goals and Review:  Exercise Goals    Row Name 01/05/20 1545             Exercise Goals   Increase Physical Activity Yes       Intervention Provide advice, education, support and counseling about physical activity/exercise needs.;Develop an individualized exercise prescription for aerobic and resistive training based on initial evaluation findings, risk stratification, comorbidities and participant's personal goals.       Expected Outcomes Short Term: Attend rehab on a regular basis to increase amount of physical activity.;Long Term: Add in home exercise to make exercise part of routine and to increase amount of physical activity.;Long Term: Exercising regularly at least 3-5 days a week.       Increase Strength and Stamina Yes       Intervention Provide advice, education, support and counseling about physical activity/exercise needs.;Develop an individualized exercise  prescription for aerobic and resistive training based on initial evaluation findings, risk stratification, comorbidities and participant's personal goals.       Expected Outcomes Short Term: Increase workloads from initial exercise prescription for resistance, speed, and METs.;Short Term: Perform resistance training exercises routinely during rehab and add in resistance training at home;Long Term: Improve cardiorespiratory fitness, muscular endurance and strength as measured by increased METs and functional capacity (6MWT)       Able to understand and use rate of perceived exertion (RPE) scale Yes       Intervention Provide education and explanation on how to use RPE scale       Expected Outcomes Short Term: Able to use RPE daily in rehab to express subjective intensity level;Long Term:  Able to use RPE to guide intensity level when exercising independently       Knowledge and understanding of Target Heart Rate Range (THRR) Yes       Intervention Provide education and explanation of THRR  including how the numbers were predicted and where they are located for reference       Expected Outcomes Short Term: Able to state/look up THRR;Short Term: Able to use daily as guideline for intensity in rehab;Long Term: Able to use THRR to govern intensity when exercising independently       Able to check pulse independently Yes       Intervention Provide education and demonstration on how to check pulse in carotid and radial arteries.;Review the importance of being able to check your own pulse for safety during independent exercise       Expected Outcomes Short Term: Able to explain why pulse checking is important during independent exercise;Long Term: Able to check pulse independently and accurately       Understanding of Exercise Prescription Yes       Intervention Provide education, explanation, and written materials on patient's individual exercise prescription       Expected Outcomes Short Term: Able to explain program exercise prescription;Long Term: Able to explain home exercise prescription to exercise independently              Exercise Goals Re-Evaluation :  Exercise Goals Re-Evaluation    Row Name 01/13/20 1630 01/29/20 1620           Exercise Goal Re-Evaluation   Exercise Goals Review Increase Physical Activity;Increase Strength and Stamina;Able to understand and use rate of perceived exertion (RPE) scale;Knowledge and understanding of Target Heart Rate Range (THRR);Able to check pulse independently;Understanding of Exercise Prescription Increase Physical Activity;Increase Strength and Stamina;Able to understand and use rate of perceived exertion (RPE) scale;Knowledge and understanding of Target Heart Rate Range (THRR);Able to check pulse independently;Understanding of Exercise Prescription      Comments Pt's first day in the CRP2 program. Pt tolerated exercise session well with no compalints. Pt understands Exercise RX, RPE scale and THRR. Reviewed home exercise program wtih  patient today. Pt voices she would like to replace her existing treadmill so she can walk on it at home. Any addtional walking encouraged 2x/week. Pt is very challenged by her work schedule. Pt verbalized understnading of the home exercise Rx and was provided aa copy.      Expected Outcomes Will continue to monitor and progress patient as tolerated. Pt will add walking at home for 30 minutes 2x/week.              Discharge Exercise Prescription (Final Exercise Prescription Changes):  Exercise Prescription Changes - 01/29/20 1545  Response to Exercise   Blood Pressure (Admit) 156/70    Blood Pressure (Exercise) 148/76    Blood Pressure (Exit) 122/84    Heart Rate (Admit) 88 bpm    Heart Rate (Exercise) 100 bpm    Heart Rate (Exit) 66 bpm    Rating of Perceived Exertion (Exercise) 13    Symptoms Chest Pressure on treadmill    Comments Reviewed Home exercise plan    Duration Continue with 30 min of aerobic exercise without signs/symptoms of physical distress.    Intensity THRR unchanged      Progression   Progression Continue to progress workloads to maintain intensity without signs/symptoms of physical distress.    Average METs 2.6      Resistance Training   Training Prescription Yes    Weight 3 lbs    Reps 10-15    Time 10 Minutes      Interval Training   Interval Training No      Treadmill   MPH 2.5    Grade 0    Minutes 15    METs 2.91      NuStep   Level 2    SPM 85    Minutes 15    METs 2.3      Home Exercise Plan   Plans to continue exercise at Home (comment)    Frequency Add 2 additional days to program exercise sessions.    Initial Home Exercises Provided 01/29/20           Nutrition:  Target Goals: Understanding of nutrition guidelines, daily intake of sodium <1539m, cholesterol <2058m calories 30% from fat and 7% or less from saturated fats, daily to have 5 or more servings of fruits and vegetables.  Biometrics:  Pre Biometrics - 01/05/20  1508      Pre Biometrics   Waist Circumference 42 inches    Hip Circumference 44 inches    Waist to Hip Ratio 0.95 %    Triceps Skinfold 28 mm    % Body Fat 41.2 %    Grip Strength 40 kg    Flexibility 14 in    Single Leg Stand 4.02 seconds            Nutrition Therapy Plan and Nutrition Goals:  Nutrition Therapy & Goals - 01/19/20 0756      Nutrition Therapy   Diet Heart healthy/carb mod    Drug/Food Interactions Statins/Certain Fruits      Personal Nutrition Goals   Nutrition Goal Pt to build a healthy plate including vegetables, fruits, whole grains, and low-fat dairy products in a heart healthy meal plan.    Personal Goal #2 Pt to learn to read labels    Personal Goal #3 Pt to prepare easy meals for on-the-go      Intervention Plan   Intervention Prescribe, educate and counsel regarding individualized specific dietary modifications aiming towards targeted core components such as weight, hypertension, lipid management, diabetes, heart failure and other comorbidities.    Expected Outcomes Short Term Goal: A plan has been developed with personal nutrition goals set during dietitian appointment.;Long Term Goal: Adherence to prescribed nutrition plan.           Nutrition Assessments:  Nutrition Assessments - 01/26/20 1033      MEDFICTS Scores   Pre Score 10           Nutrition Goals Re-Evaluation:  Nutrition Goals Re-Evaluation    Row Name 01/19/20 0757 02/02/20 [id number]  Goals   Current Weight 179 lb (81.2 kg) 174 lb 2.6 oz (79 kg)      Nutrition Goal Pt to build a healthy plate including vegetables, fruits, whole grains, and low-fat dairy products in a heart healthy meal plan. Pt to build a healthy plate including vegetables, fruits, whole grains, and low-fat dairy products in a heart healthy meal plan.      Comment -- Reviewed label reading edu        Personal Goal #2 Re-Evaluation   Personal Goal #2 Pt to learn to read labels Pt to learn to read  labels        Personal Goal #3 Re-Evaluation   Personal Goal #3 Pt to prepare easy meals for on-the-go Pt to prepare easy meals for on-the-go             Nutrition Goals Discharge (Final Nutrition Goals Re-Evaluation):  Nutrition Goals Re-Evaluation - 02/02/20 0904      Goals   Current Weight 174 lb 2.6 oz (79 kg)    Nutrition Goal Pt to build a healthy plate including vegetables, fruits, whole grains, and low-fat dairy products in a heart healthy meal plan.    Comment Reviewed label reading edu      Personal Goal #2 Re-Evaluation   Personal Goal #2 Pt to learn to read labels      Personal Goal #3 Re-Evaluation   Personal Goal #3 Pt to prepare easy meals for on-the-go           Psychosocial: Target Goals: Acknowledge presence or absence of significant depression and/or stress, maximize coping skills, provide positive support system. Participant is able to verbalize types and ability to use techniques and skills needed for reducing stress and depression.  Initial Review & Psychosocial Screening:  Initial Psych Review & Screening - 01/05/20 1609      Initial Review   Current issues with Current Stress Concerns    Source of Stress Concerns Family;Financial;Occupation;Chronic Illness    Comments Ms. Semple admits to current stressors including her health, recent loss of her husband unexpectedly 1 year ago, finances, and her job. 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 She admits to having a good support system and denies further interventions related to stress at this time.      Family Dynamics   Good Support System? Yes    Concerns Recent loss of significant other      Barriers   Psychosocial barriers to participate in program The patient should benefit from training in stress management and relaxation.      Screening Interventions   Interventions Encouraged to  exercise;Provide feedback about the scores to participant           Quality of Life Scores:  Quality of Life - 01/05/20 1602      Quality of Life   Select Quality of Life      Quality of Life Scores   Health/Function Pre 19.96 %    Socioeconomic Pre 26.64 %    Psych/Spiritual Pre 22.07 %    Family Pre 22.5 %    GLOBAL Pre 22.27 %          Scores of 19 and below usually indicate a poorer quality of life in these areas.  A difference of  2-3 points is a clinically meaningful difference.  A difference of 2-3 points in the total score of the Quality of Life  Index has been associated with significant improvement in overall quality of life, self-image, physical symptoms, and general health in studies assessing change in quality of life.  PHQ-9: Recent Review Flowsheet Data    Depression screen [facility] 2/9 01/05/2020 04/26/2014 01/13/2014   Decreased Interest 0 0 0   Down, Depressed, Hopeless 1 0 0   PHQ - 2 Score 1 0 0     Interpretation of Total Score  Total Score Depression Severity:  1-4 = Minimal depression, 5-9 = Mild depression, 10-14 = Moderate depression, 15-19 = Moderately severe depression, 20-27 = Severe depression   Psychosocial Evaluation and Intervention:   Psychosocial Re-Evaluation:  Psychosocial Re-Evaluation    [place] Name 01/14/20 1406 02/04/20 1243           Psychosocial Re-Evaluation   Current issues with Current Stress Concerns Current Stress Concerns      Comments Zannie is still mourning the loss of her husband one year ago. Patie t does have her 3 sons for support Venisha is still mourning the loss of her husband one year ago. Shanekia does have her 3 sons for support      Expected Outcomes Will continue to offer emotional support as needed Will continue to offer emotional support as needed      Interventions Encouraged to attend Cardiac Rehabilitation for the exercise;Stress management education Encouraged to attend Cardiac Rehabilitation for the exercise;Stress  management education      Continue Psychosocial Services  Follow up required by staff Follow up required by staff      Comments -- Ms. Mccay admits to current stressors including her health, recent loss of her husband unexpectedly 1 year ago, finances, and her job. 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 She admits to having a good support system and denies further interventions related to stress at this time.        Initial Review   Source of Stress Concerns Family Family             Psychosocial Discharge (Final Psychosocial Re-Evaluation):  Psychosocial Re-Evaluation - 02/04/20 1243      Psychosocial Re-Evaluation   Current issues with Current Stress Concerns    Comments Coletta is still mourning the loss of her husband one year ago. Rhylynn does have her 3 sons for support    Expected Outcomes Will continue to offer emotional support as needed    Interventions Encouraged to attend Cardiac Rehabilitation for the exercise;Stress management education    Continue Psychosocial Services  Follow up required by staff    Comments Ms. Rauen admits to current stressors including her health, recent loss of her husband unexpectedly 1 year ago, finances, and her job. 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 She admits to having a good support system and denies further interventions related to stress at this time.      Initial Review   Source of Stress Concerns Family           Vocational Rehabilitation: Provide vocational rehab assistance to qualifying candidates.   Vocational Rehab Evaluation & Intervention:  Vocational Rehab - 01/05/20 1512      Initial Vocational Rehab Evaluation & Intervention   Assessment shows need for Vocational Rehabilitation No           Education: Education Goals:  Education classes will be provided  on a weekly basis, covering required topics. Participant will state understanding/return demonstration of topics presented.  Learning Barriers/Preferences:  Learning Barriers/Preferences - 01/05/20 1603      Learning Barriers/Preferences   Learning Barriers Sight   Waers glasses   Learning Preferences None           Education Topics: Hypertension, Hypertension Reduction -Define heart disease and high blood pressure. Discus how high blood pressure affects the body and ways to reduce high blood pressure.   Exercise and Your Heart -Discuss why it is important to exercise, the FITT principles of exercise, normal and abnormal responses to exercise, and how to exercise safely.   Angina -Discuss definition of angina, causes of angina, treatment of angina, and how to decrease risk of having angina.   Cardiac Medications -Review what the following cardiac medications are used for, how they affect the body, and side effects that may occur when taking the medications.  Medications include Aspirin, Beta blockers, calcium channel blockers, ACE Inhibitors, angiotensin receptor blockers, diuretics, digoxin, and antihyperlipidemics.   Congestive Heart Failure -Discuss the definition of CHF, how to live with CHF, the signs and symptoms of CHF, and how keep track of weight and sodium intake.   Heart Disease and Intimacy -Discus the effect sexual activity has on the heart, how changes occur during intimacy as we age, and safety during sexual activity.   Smoking Cessation / COPD -Discuss different methods to quit smoking, the health benefits of quitting smoking, and the definition of COPD.   Nutrition I: Fats -Discuss the types of cholesterol, what cholesterol does to the heart, and how cholesterol levels can be controlled.   Nutrition II: Labels -Discuss the different components of food labels and how to read food label   Heart Parts/Heart Disease and  PAD -Discuss the anatomy of the heart, the pathway of blood circulation through the heart, and these are affected by heart disease.   Stress I: Signs and Symptoms -Discuss the causes of stress, how stress may lead to anxiety and depression, and ways to limit stress.   Stress II: Relaxation -Discuss different types of relaxation techniques to limit stress.   Warning Signs of Stroke / TIA -Discuss definition of a stroke, what the signs and symptoms are of a stroke, and how to identify when someone is having stroke.   Knowledge Questionnaire Score:  Knowledge Questionnaire Score - 01/05/20 1604      Knowledge Questionnaire Score   Pre Score 23/24           Core Components/Risk Factors/Patient Goals at Admission:  Personal Goals and Risk Factors at Admission - 01/05/20 1604      Core Components/Risk Factors/Patient Goals on Admission    Weight Management Yes;Weight Loss    Admit Weight 175 lb 7.8 oz (79.6 kg)    Expected Outcomes Short Term: Continue to assess and modify interventions until short term weight is achieved;Long Term: Adherence to nutrition and physical activity/exercise program aimed toward attainment of established weight goal;Weight Maintenance: Understanding of the daily nutrition guidelines, which includes 25-35% calories from fat, 7% or less cal from saturated fats, less than 246m cholesterol, less than 1.5gm of sodium, & 5 or more servings of fruits and vegetables daily;Weight Loss: Understanding of general recommendations for a balanced deficit meal plan, which promotes 1-2 lb weight loss per week and includes a negative energy balance of [phone] kcal/d;Understanding recommendations for meals to include 15-35% energy as protein, 25-35% energy from fat, 35-60% energy from carbohydrates, less than  267m of dietary cholesterol, 20-35 gm of total fiber daily;Understanding of distribution of calorie intake throughout the day with the consumption of 4-5 meals/snacks     Diabetes Yes    Intervention Provide education about signs/symptoms and action to take for hypo/hyperglycemia.;Provide education about proper nutrition, including hydration, and aerobic/resistive exercise prescription along with prescribed medications to achieve blood glucose in normal ranges: Fasting glucose 65-99 mg/dL    Expected Outcomes Short Term: Participant verbalizes understanding of the signs/symptoms and immediate care of hyper/hypoglycemia, proper foot care and importance of medication, aerobic/resistive exercise and nutrition plan for blood glucose control.;Long Term: Attainment of HbA1C < 7%.    Hypertension Yes    Intervention Provide education on lifestyle modifcations including regular physical activity/exercise, weight management, moderate sodium restriction and increased consumption of fresh fruit, vegetables, and low fat dairy, alcohol moderation, and smoking cessation.;Monitor prescription use compliance.    Expected Outcomes Short Term: Continued assessment and intervention until BP is < 140/963mHG in hypertensive participants. < 130/802mG in hypertensive participants with diabetes, heart failure or chronic kidney disease.;Long Term: Maintenance of blood pressure at goal levels.    Lipids Yes    Intervention Provide education and support for participant on nutrition & aerobic/resistive exercise along with prescribed medications to achieve LDL <75m15mDL >40mg58m Expected Outcomes Short Term: Participant states understanding of desired cholesterol values and is compliant with medications prescribed. Participant is following exercise prescription and nutrition guidelines.;Long Term: Cholesterol controlled with medications as prescribed, with individualized exercise RX and with personalized nutrition plan. Value goals: LDL < 75mg,68m > 40 mg.    Stress Yes    Intervention Offer individual and/or small group education and counseling on adjustment to heart disease, stress management  and health-related lifestyle change. Teach and support self-help strategies.;Refer participants experiencing significant psychosocial distress to appropriate mental health specialists for further evaluation and treatment. When possible, include family members and significant others in education/counseling sessions.    Expected Outcomes Short Term: Participant demonstrates changes in health-related behavior, relaxation and other stress management skills, ability to obtain effective social support, and compliance with psychotropic medications if prescribed.;Long Term: Emotional wellbeing is indicated by absence of clinically significant psychosocial distress or social isolation.           Core Components/Risk Factors/Patient Goals Review:   Goals and Risk Factor Review    Row Name 01/14/20 1408 02/04/20 1244           Core Components/Risk Factors/Patient Goals Review   Personal Goals Review Weight Management/Obesity;Stress;Hypertension;Diabetes;Lipids Weight Management/Obesity;Stress;Hypertension;Diabetes;Lipids      Review Fonnie sKaytelynned exercise on 01/14/20 and did well with exercise. Lourene vital signs and CBG's were stable Desiree hMakaylaheen doing well with exercise. Lachlan dCatilynas some intermittent exertional blood pressures that were forwarded to Dr Oneal.Marisue Ivans have been stable.      Expected Outcomes Patient will continue to participate in phase 2 cardiac rehab for exercise, nutrition and lifstyle modifications Patient will continue to participate in phase 2 cardiac rehab for exercise, nutrition and lifstyle modifications             Core Components/Risk Factors/Patient Goals at Discharge (Final Review):   Goals and Risk Factor Review - 02/04/20 1244      Core Components/Risk Factors/Patient Goals Review   Personal Goals Review Weight Management/Obesity;Stress;Hypertension;Diabetes;Lipids    Review Hayly hGerricaeen doing well with exercise. Sahily dTanzieas some intermittent exertional blood pressures  that were forwarded to Dr Oneal.Marisue Ivans have  been stable.    Expected Outcomes Patient will continue to participate in phase 2 cardiac rehab for exercise, nutrition and lifstyle modifications           ITP Comments:  ITP Comments    Row Name 01/05/20 1504 01/14/20 1405 02/04/20 1241       ITP Comments Dr. Fransico Him, Medical Director [facility] 30 Day ITP Review. Reshunda started exercise on 01/14/20 and did well with exercise 30 Day ITP Review. Sybil has good attendance and participation in phase 2 cardiac rehab.            Comments: See ITP Comments.Barnet Pall, RN,BSN 02/04/2020 12:47 PM

## 2020-02-04 NOTE — Progress Notes (Signed)
QUALITY OF LIFE SCORE REVIEW  Pt completed Quality of Life survey as a participant in Cardiac Rehab. Scores 21.0 or below are considered low. Pt score very low in health and functioning Overall 22.27, Health and Function 19.96, socioeconomic 26.64, physiological and spiritual 22.07, family 19.96. Patient quality of life slightly altered by physical constraints which limits ability to perform as prior to recent cardiac illness. Merelyn voiced feeling slightly dissatisfied with her health due to her recent hospitalization for a MI and Stent.Liana Gerold emotional support and reassurance.  Lun is also still grieving from the sudden loss of her husband last November. Yamilee does have a grief support group who she meets with once a month and finds helpful. Will continue to monitor and intervene as necessary. Also talked with Ruhi about her current job stress and she has a very stressful job. Barnet Pall, RN,BSN 02/04/2020 12:37 PM

## 2020-02-04 NOTE — Progress Notes (Signed)
Nutrition Note - Follow Up  Spoke with pt today about carbohydrate counting, and eating a consistent amount of carbohydrates across the day. Reviewed the benefits of carbohydrate counting and eating a consistent amount of carbohydrates across the day. Showed pt how to calculate carbohydrate servings, and distributed handouts for patient to practice. Recommended pt eat 45-60 g carbohydrates at meals and 15-30 g carbs for snacks. Discussed the importance of creating a balanced meal with the addition of protein and non-starchy vegetables. Distributed recipes and snack ideas to patient to try. Additionally discussed with patient that exercise may cause blood sugar to decrease and that we may need to add in a snack before or after workout, to manage any changes. Distributed handout of snack ideas that would provide a combination of carbohydrates and protein. Pt verbalized understanding of material discussed today. Distributed RD contact information.     Michaele Offer, MS, RDN, LDN

## 2020-02-05 ENCOUNTER — Encounter (HOSPITAL_COMMUNITY): Payer: BC Managed Care – PPO

## 2020-02-08 ENCOUNTER — Encounter (HOSPITAL_COMMUNITY): Payer: BC Managed Care – PPO

## 2020-02-10 ENCOUNTER — Other Ambulatory Visit: Payer: Self-pay

## 2020-02-10 ENCOUNTER — Encounter (HOSPITAL_COMMUNITY)
Admission: RE | Admit: 2020-02-10 | Discharge: 2020-02-10 | Disposition: A | Discharge status: Home / Self Care | Payer: BC Managed Care – PPO | Source: Ambulatory Visit | Attending: Internal Medicine | Admitting: Internal Medicine

## 2020-02-10 DIAGNOSIS — I2111 ST elevation (STEMI) myocardial infarction involving right coronary artery: Secondary | ICD-10-CM | POA: Insufficient documentation

## 2020-02-12 ENCOUNTER — Other Ambulatory Visit: Payer: Self-pay

## 2020-02-12 ENCOUNTER — Encounter (HOSPITAL_COMMUNITY)
Admission: RE | Admit: 2020-02-12 | Discharge: 2020-02-12 | Disposition: A | Discharge status: Home / Self Care | Payer: BC Managed Care – PPO | Source: Ambulatory Visit | Attending: Internal Medicine | Admitting: Internal Medicine

## 2020-02-12 DIAGNOSIS — I2111 ST elevation (STEMI) myocardial infarction involving right coronary artery: Secondary | ICD-10-CM

## 2020-02-15 ENCOUNTER — Other Ambulatory Visit: Payer: Self-pay

## 2020-02-15 ENCOUNTER — Encounter (HOSPITAL_COMMUNITY)
Admission: RE | Admit: 2020-02-15 | Discharge: 2020-02-15 | Disposition: A | Discharge status: Home / Self Care | Payer: BC Managed Care – PPO | Source: Ambulatory Visit | Attending: Internal Medicine | Admitting: Internal Medicine

## 2020-02-15 DIAGNOSIS — I2111 ST elevation (STEMI) myocardial infarction involving right coronary artery: Secondary | ICD-10-CM | POA: Diagnosis not present

## 2020-02-17 ENCOUNTER — Encounter (HOSPITAL_COMMUNITY)
Admission: RE | Admit: 2020-02-17 | Discharge: 2020-02-17 | Disposition: A | Discharge status: Home / Self Care | Payer: BC Managed Care – PPO | Source: Ambulatory Visit | Attending: Internal Medicine | Admitting: Internal Medicine

## 2020-02-17 ENCOUNTER — Other Ambulatory Visit: Payer: Self-pay

## 2020-02-17 DIAGNOSIS — I2111 ST elevation (STEMI) myocardial infarction involving right coronary artery: Secondary | ICD-10-CM | POA: Diagnosis not present

## 2020-02-18 ENCOUNTER — Encounter: Payer: Self-pay | Admitting: Skilled Nursing Facility1

## 2020-02-18 ENCOUNTER — Ambulatory Visit: Payer: 59 | Admitting: Skilled Nursing Facility1

## 2020-02-18 ENCOUNTER — Encounter: Payer: BC Managed Care – PPO | Attending: Cardiovascular Disease | Admitting: Skilled Nursing Facility1

## 2020-02-18 DIAGNOSIS — E119 Type 2 diabetes mellitus without complications: Secondary | ICD-10-CM | POA: Diagnosis not present

## 2020-02-18 NOTE — Progress Notes (Signed)
Assessment:  Primary concerns today: heart diease/diabetes diet.   Appt conducted via MyChart   Pt states she needs ideas for how she should be eating with having heart disease and diabetes.  Pt states her husband passed away last year.  Pt states she checks her blood sugars about 5 times a week only fasting stating her doctor told her she did not need to test her blood sugar post prandial: Dietitian advised pt if she wanted to know how certain foods affect her blood sugar she could check her blood sugar 2 hours after eating to see how her body reacts to that food.  Pt states she does not eat frozen foods and does not want to meal plan.   MEDICATIONS: see list   DIETARY INTAKE:  Usual eating pattern includes 3 meals and 1 snacks per day.  Everyday foods include none stated.  Avoided foods include none stated.    24-hr recall:  B ( AM): Rx bar + coffee drink Snk ( AM):  L ( PM): sandwich or salad Snk ( PM):  D ( PM): salmon or salad Snk ( PM):  Beverages: flavored water, water, coffee  Usual physical activity:   Estimated energy needs: 1500 calories  Progress Towards Goal(s):  In progress.     Intervention:  Nutrition counseling. Dietitian educated pt on a healthy diet within the context of heart disease AND diabetes Dietitian gave several very specific meal ideas. Dietitian offered diabetes food hub for easy to find recipes as well as the american diabetes association and other websites as appropriate resources for information cautioning on .com websites. Importance of vegetables To have an overall healthy diet, adult men and women are recommended to consume anywhere from 2-3 cups of vegetables daily. Vegetables provide a wide range of vitamins and minerals such as vitamin A, vitamin C, potassium, and folic acid. According to the [employer], including fruit and vegetables daily may reduce the risk of cardiovascular disease, certain cancers, and other  non-communicable diseases. Encouraged patient to honor their body's internal hunger and fullness cues.  Throughout the day, check in mentally and rate hunger. Stop eating when satisfied not full regardless of how much food is left on the plate.  Get more if still hungry 20-30 minutes later.  The key is to honor satisfaction so throughout the meal, rate fullness factor and stop when comfortably satisfied not physically full. The key is to honor hunger and fullness without any feelings of guilt or shame.  Pay attention to what the internal cues are, rather than any external factors. This will enhance the confidence you have in listening to your own body and following those internal cues enabling you to increase how often you eat when you are hungry not out of appetite and stop when you are satisfied not full.  Encouraged pt to continue to eat balanced meals inclusive of non starchy vegetables 2 times a day 7 days a week Encouraged pt to choose lean protein sources: limiting beef, pork, sausage, hotdogs, and lunch meat Encourage pt to choose healthy fats such as plant based limiting animal fats Encouraged pt to continue to drink a minium 64 fluid ounces with half being plain water to satisfy proper hydration   Teaching Method Utilized:  Visual Auditory Hands on  Handouts given during visit include:  Snack ideas  Meal ideas sheet  Barriers to learning/adherence to lifestyle change: pre contemplative stage of change  Demonstrated degree of understanding via:  Teach Back   Monitoring/Evaluation:  Dietary intake, exercise, and body weight prn.

## 2020-02-19 ENCOUNTER — Other Ambulatory Visit: Payer: Self-pay

## 2020-02-19 ENCOUNTER — Encounter (HOSPITAL_COMMUNITY)
Admission: RE | Admit: 2020-02-19 | Discharge: 2020-02-19 | Disposition: A | Discharge status: Home / Self Care | Payer: BC Managed Care – PPO | Source: Ambulatory Visit | Attending: Internal Medicine | Admitting: Internal Medicine

## 2020-02-19 DIAGNOSIS — I2111 ST elevation (STEMI) myocardial infarction involving right coronary artery: Secondary | ICD-10-CM

## 2020-02-22 ENCOUNTER — Encounter (HOSPITAL_COMMUNITY)
Admission: RE | Admit: 2020-02-22 | Discharge: 2020-02-22 | Disposition: A | Discharge status: Home / Self Care | Payer: BC Managed Care – PPO | Source: Ambulatory Visit | Attending: Internal Medicine | Admitting: Internal Medicine

## 2020-02-22 ENCOUNTER — Other Ambulatory Visit: Payer: Self-pay

## 2020-02-22 DIAGNOSIS — I2111 ST elevation (STEMI) myocardial infarction involving right coronary artery: Secondary | ICD-10-CM | POA: Diagnosis not present

## 2020-02-23 NOTE — Progress Notes (Signed)
Sarah Ali 64 y.o. female Nutrition Note Spoke with pt.  Reviewed goals. Identified barriers. She is still eating the same thing each night and feels she needs variety. Barriers include busy job and she had not previously done the cooking in her home.  She asked about an air fryer. Discussed the benefits.  She has the knowledge of a heart healthy diet. She needs more direction in food choices. Provided easy meals and grocery items she can purchase at [employer] where she normally shops. Provided ideas and written instruction.   Pt verbalizes understanding.  Will continue to monitor pt while in cardiac rehab.  Lab Results  Component Value Date   HGBA1C 6.8 (H) 11/16/2019    Wt Readings from Last 3 Encounters:  01/15/20 179 lb 9.6 oz (81.5 kg)  01/05/20 175 lb 7.8 oz (79.6 kg)  12/08/19 179 lb (81.2 kg)    Nutrition Diagnosis   Food-and nutrition-related knowledge deficit related to lack of exposure to information as related to diagnosis of: ? CVD ? Type 2 Diabetes   Nutrition Intervention   Pt's individual nutrition plan reviewed with pt.  Benefits of adopting Heart Healthy diet discussed when Med[employer] reviewed.                  Continue client-centered nutrition education by RD, as part of interdisciplinary care.  Goal(s)   Pt to build a healthy plate including vegetables, fruits, whole grains, and low-fat dairy products in a heart healthy meal plan. -(met)  Pt to learn to read labels - (met)  Pt to prepare easy meals for on-the-go (ongoing)  Make sheet pan meals  Choose precut fruits and veggies  Plan:  Will provide client-centered nutrition education as part of interdisciplinary care  Monitor and evaluate progress toward nutrition goal with team.   Sarah Offer, MS, RDN, LDN

## 2020-02-24 ENCOUNTER — Other Ambulatory Visit: Payer: Self-pay

## 2020-02-24 ENCOUNTER — Encounter (HOSPITAL_COMMUNITY)
Admission: RE | Admit: 2020-02-24 | Discharge: 2020-02-24 | Disposition: A | Discharge status: Home / Self Care | Payer: BC Managed Care – PPO | Source: Ambulatory Visit | Attending: Internal Medicine | Admitting: Internal Medicine

## 2020-02-24 DIAGNOSIS — I2111 ST elevation (STEMI) myocardial infarction involving right coronary artery: Secondary | ICD-10-CM | POA: Diagnosis not present

## 2020-02-26 ENCOUNTER — Encounter (HOSPITAL_COMMUNITY)
Admission: RE | Admit: 2020-02-26 | Discharge: 2020-02-26 | Disposition: A | Discharge status: Home / Self Care | Payer: BC Managed Care – PPO | Source: Ambulatory Visit | Attending: Internal Medicine | Admitting: Internal Medicine

## 2020-02-26 ENCOUNTER — Other Ambulatory Visit: Payer: Self-pay

## 2020-02-26 DIAGNOSIS — I2111 ST elevation (STEMI) myocardial infarction involving right coronary artery: Secondary | ICD-10-CM

## 2020-02-29 ENCOUNTER — Other Ambulatory Visit: Payer: Self-pay

## 2020-02-29 ENCOUNTER — Encounter (HOSPITAL_COMMUNITY)
Admission: RE | Admit: 2020-02-29 | Discharge: 2020-02-29 | Disposition: A | Discharge status: Home / Self Care | Payer: BC Managed Care – PPO | Source: Ambulatory Visit | Attending: Internal Medicine | Admitting: Internal Medicine

## 2020-02-29 DIAGNOSIS — I2111 ST elevation (STEMI) myocardial infarction involving right coronary artery: Secondary | ICD-10-CM

## 2020-03-02 ENCOUNTER — Encounter (HOSPITAL_COMMUNITY): Payer: BC Managed Care – PPO

## 2020-03-03 NOTE — Progress Notes (Signed)
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 Swallow whole., Disp: 90 tablet, Rfl: 3   atorvastatin (LIPITOR) 80 MG tablet, Take 1 tablet (80 mg total) by mouth daily at 6 PM., Disp: 90 tablet, Rfl: 3   carvedilol (COREG) 12.5 MG tablet, Take 1 tablet (12.5 mg total) by mouth 2 (two) times daily., Disp: 180 tablet, Rfl: 3   Cholecalciferol (VITAMIN D) 2000 units tablet, Take 2,000 Units by mouth daily., Disp: , Rfl:    ezetimibe (ZETIA) 10 MG tablet, Take 1 tablet (10 mg total) by mouth daily., Disp: 90 tablet, Rfl: 3   fexofenadine (ALLEGRA) 60 MG tablet, Take 60 mg by mouth daily as needed for allergies or rhinitis. (Patient not taking: Reported on 01/29/2020), Disp: , Rfl:    JANUVIA 100 MG tablet, Take 100 mg by mouth daily., Disp: , Rfl:    metFORMIN (GLUCOPHAGE-XR) 500 MG 24 hr tablet, Take 1,000 mg by mouth 2 (two) times daily., Disp: , Rfl:    Multiple Vitamins-Minerals (MULTIVITAMIN WITH MINERALS) tablet, Take 1 tablet by mouth daily., Disp: , Rfl:    nitroGLYCERIN (NITROSTAT) 0.4 MG SL tablet, Place 1 tablet (0.4 mg total) under the tongue every 5 (five) minutes as needed for chest pain., Disp: 25 tablet, Rfl: 3   olmesartan (BENICAR) 40 MG tablet, Take 40 mg by mouth daily., Disp: , Rfl:    prasugrel (EFFIENT) 10 MG TABS tablet, Take 1 tablet (10 mg total) by mouth  daily., Disp: 90 tablet, Rfl: 3   vitamin E 400 UNIT capsule, Take 400 Units by mouth daily., Disp: , Rfl:   Past Medical History: Past Medical History:  Diagnosis Date   Asthma    childhood   BRCA1 positive    Bronchitis    Diabetes mellitus without complication ([place])    Eczema    Family history of breast cancer    Family history of ovarian cancer    High cholesterol    Hypertension    Pneumonia     Tobacco Use: Social History   Tobacco Use  Smoking Status Never Smoker  Smokeless Tobacco Never Used    Labs: Recent Review Scientist, physiological    Labs for ITP Cardiac and Pulmonary Rehab Latest Ref Rng & Units 11/16/2019 01/08/2020   Cholestrol 100 - 199 mg/dL 186 152   LDLCALC 0 - 99 mg/dL 85 85   HDL >39 mg/dL 44 42   Trlycerides 0 - 149 mg/dL 285(H) 139   Hemoglobin A1c 4.8 - 5.6 % 6.8(H) -      Capillary Blood Glucose: Lab Results  Component Value Date   GLUCAP 103 (H) 01/18/2020   GLUCAP 111 (H) 01/18/2020   GLUCAP 106 (H) 01/13/2020   GLUCAP 113 (H) 01/13/2020   GLUCAP 163 (H) 11/18/2019     Exercise Target Goals: Exercise Program Goal: Individual exercise prescription set using results from initial 6  min walk test and THRR while considering  patients activity barriers and safety.   Exercise Prescription Goal: Starting with aerobic activity 30 plus minutes a day, 3 days per week for initial exercise prescription. Provide home exercise prescription and guidelines that participant acknowledges understanding prior to discharge.  Activity Barriers & Risk Stratification:  Activity Barriers & Cardiac Risk Stratification - 01/05/20 1542      Activity Barriers & Cardiac Risk Stratification   Activity Barriers Balance Concerns   Scored 4.02 seconds on balance test   Cardiac Risk Stratification High           6 Minute Walk:  6 Minute Walk    Row Name 01/05/20 1540 02/29/20 1520       6 Minute Walk   Phase Initial Discharge (P)     Distance 1702  feet 1742 feet (P)     Distance Feet Change -- 40 ft (P)     Walk Time 6 minutes 6 minutes (P)     # of Rest Breaks 0 0 (P)     MPH 3.22 --    METS 4.21 --    RPE 11 --    Perceived Dyspnea  1 --    VO2 Peak 14.72 --    Symptoms Yes (comment) --    Comments Mild SOB, RPD = 1 --    Resting HR 70 bpm --    Resting BP 158/82 --    Resting Oxygen Saturation  96 % --    Exercise Oxygen Saturation  during 6 min walk 97 % --    Max Ex. HR 97 bpm --    Max Ex. BP 162/86 --    2 Minute Post BP 140/72 --           Oxygen Initial Assessment:   Oxygen Re-Evaluation:   Oxygen Discharge (Final Oxygen Re-Evaluation):   Initial Exercise Prescription:  Initial Exercise Prescription - 01/05/20 1500      Date of Initial Exercise RX and Referring Provider   Date 01/05/20    Referring Provider End, Harrell Gave    Expected Discharge Date 03/04/20      Treadmill   MPH 2.5    Grade 0    Minutes 15    METs 2.91      NuStep   Level 2    SPM 75    Minutes 15    METs 2.2      Prescription Details   Frequency (times per week) 3    Duration Progress to 30 minutes of continuous aerobic without signs/symptoms of physical distress      Intensity   THRR 40-80% of Max Heartrate 64-127    Ratings of Perceived Exertion 11-13    Perceived Dyspnea 0-4      Progression   Progression Continue progressive overload as per policy without signs/symptoms or physical distress.      Resistance Training   Training Prescription Yes    Weight 3 lbs    Reps 10-15           Perform Capillary Blood Glucose checks as needed.  Exercise Prescription Changes:  Exercise Prescription Changes    Row Name 01/13/20 1630 01/25/20 1635 01/29/20 1545 02/15/20 1615 02/29/20 1630     Response to Exercise   Blood Pressure (Admit) 140/86 120/82 156/70 112/68 124/78   Blood Pressure (Exercise) 154/80 136/84 148/76 132/78 152/80   Blood Pressure (Exit) 140/80 120/80 122/84 140/82 142/74   Heart Rate  (Admit) 83 bpm 81  bpm 88 bpm 75 bpm 63 bpm   Heart Rate (Exercise) 99 bpm 99 bpm 100 bpm 98 bpm 101 bpm   Heart Rate (Exit) 75 bpm 71 bpm 66 bpm 72 bpm 72 bpm   Rating of Perceived Exertion (Exercise) _0 Symptoms None None Chest Pressure on treadmill None None   Comments Pt's first day of exercise in the CRP2 program. Reviewed METs Session # 4 Reviewed Home exercise plan iewed Mets and Goals Reviewed METS   Duration Progress to 30 minutes of  aerobic without signs/symptoms of physical distress Progress to 30 minutes of  aerobic without signs/symptoms of physical distress Continue with 30 min of aerobic exercise without signs/symptoms of physical distress. Continue with 30 min of aerobic exercise without signs/symptoms of physical distress. Continue with 30 min of aerobic exercise without signs/symptoms of physical distress.   Intensity _1      Progression   Progression Continue to progress workloads to maintain intensity without signs/symptoms of physical distress. Continue to progress workloads to maintain intensity without signs/symptoms of physical distress. Continue to progress workloads to maintain intensity without signs/symptoms of physical distress. Continue to progress workloads to maintain intensity without signs/symptoms of physical distress. Continue to progress workloads to maintain intensity without signs/symptoms of physical distress.   Average METs 2.7 2.6 2.6 2.9 3.3     Resistance Training   Training Prescription No Yes Yes Yes Yes   Weight --  No free weights on Wednesday. Will use 3 lb wts. M, F 3 lbs 3 lbs 3 lbs 3 lbs   Reps -- 10-15 10-15 10-15 10-15   Time -- 10 Minutes 10 Minutes 10 Minutes 10 Minutes     Interval Training   Interval Training _2      Treadmill   MPH 2.5 2.5 2.5 2.5 2.7   Grade 0 0 0 1 1   Minutes _3 METs 2.91 2.91 2.91 3.26 3.44     NuStep    Level _4 SPM 85 85 85 85 85   Minutes _5 METs 2.4 2.3 2.3 2.6 3.1     Home Exercise Plan   Plans to continue exercise at -- -- Home (comment) Home (comment) Home (comment)   Frequency -- -- Add 2 additional days to program exercise sessions. Add 2 additional days to program exercise sessions. Add 2 additional days to program exercise sessions.   Initial Home Exercises Provided -- -- 01/29/20 01/29/20 01/29/20          Exercise Comments:  Exercise Comments    Row Name 01/13/20 1632 01/25/20 1635 01/29/20 1622 02/15/20 1630 02/29/20 1630   Exercise Comments Pt's first day of exercise the CRP2 program. Pt tolerated exercise session well with no complints. Will continue to monitor patient and progress as tolerated. Reviewed METs today for patient progress. Reviewed home exercise program with patient today. Pt verbalized understanding of home exercise Rx and was provided copy. Reviewed METs and goals. Pt is making good progress in the CRP2 program. Reviwed METS, pt continues to make stady progress.          Exercise Goals and Review:  Exercise Goals    Row Name 01/05/20 1545             Exercise Goals   Increase Physical Activity Yes  Intervention Provide advice, education, support and counseling about physical activity/exercise needs.;Develop an individualized exercise prescription for aerobic and resistive training based on initial evaluation findings, risk stratification, comorbidities and participant's personal goals.       Expected Outcomes Short Term: Attend rehab on a regular basis to increase amount of physical activity.;Long Term: Add in home exercise to make exercise part of routine and to increase amount of physical activity.;Long Term: Exercising regularly at least 3-5 days a week.       Increase Strength and Stamina Yes       Intervention Provide advice, education, support and counseling about physical activity/exercise needs.;Develop an  individualized exercise prescription for aerobic and resistive training based on initial evaluation findings, risk stratification, comorbidities and participant's personal goals.       Expected Outcomes Short Term: Increase workloads from initial exercise prescription for resistance, speed, and METs.;Short Term: Perform resistance training exercises routinely during rehab and add in resistance training at home;Long Term: Improve cardiorespiratory fitness, muscular endurance and strength as measured by increased METs and functional capacity (6MWT)       Able to understand and use rate of perceived exertion (RPE) scale Yes       Intervention Provide education and explanation on how to use RPE scale       Expected Outcomes Short Term: Able to use RPE daily in rehab to express subjective intensity level;Long Term:  Able to use RPE to guide intensity level when exercising independently       Knowledge and understanding of Target Heart Rate Range (THRR) Yes       Intervention Provide education and explanation of THRR including how the numbers were predicted and where they are located for reference       Expected Outcomes Short Term: Able to state/look up THRR;Short Term: Able to use daily as guideline for intensity in rehab;Long Term: Able to use THRR to govern intensity when exercising independently       Able to check pulse independently Yes       Intervention Provide education and demonstration on how to check pulse in carotid and radial arteries.;Review the importance of being able to check your own pulse for safety during independent exercise       Expected Outcomes Short Term: Able to explain why pulse checking is important during independent exercise;Long Term: Able to check pulse independently and accurately       Understanding of Exercise Prescription Yes       Intervention Provide education, explanation, and written materials on patient's individual exercise prescription       Expected Outcomes  Short Term: Able to explain program exercise prescription;Long Term: Able to explain home exercise prescription to exercise independently              Exercise Goals Re-Evaluation :  Exercise Goals Re-Evaluation    Row Name 01/13/20 1630 01/29/20 1620 02/15/20 1630         Exercise Goal Re-Evaluation   Exercise Goals Review Increase Physical Activity;Increase Strength and Stamina;Able to understand and use rate of perceived exertion (RPE) scale;Knowledge and understanding of Target Heart Rate Range (THRR);Able to check pulse independently;Understanding of Exercise Prescription Increase Physical Activity;Increase Strength and Stamina;Able to understand and use rate of perceived exertion (RPE) scale;Knowledge and understanding of Target Heart Rate Range (THRR);Able to check pulse independently;Understanding of Exercise Prescription Increase Physical Activity;Increase Strength and Stamina;Able to understand and use rate of perceived exertion (RPE) scale;Knowledge and understanding of Target Heart Rate Range (THRR);Able  to check pulse independently;Understanding of Exercise Prescription     Comments Pt's first day in the CRP2 program. Pt tolerated exercise session well with no compalints. Pt understands Exercise RX, RPE scale and THRR. Reviewed home exercise program wtih patient today. Pt voices she would like to replace her existing treadmill so she can walk on it at home. Any addtional walking encouraged 2x/week. Pt is very challenged by her work schedule. Pt verbalized understnading of the home exercise Rx and was provided aa copy. Reviewed METs and Goals. Pt is making good progress in the CRP2 program. Pt still has not been able to replace her treadmill at home. Continue to encouraged supplemental walking.     Expected Outcomes Will continue to monitor and progress patient as tolerated. Pt will add walking at home for 30 minutes 2x/week. Continue to monitor and progress patient as tolerated.              Discharge Exercise Prescription (Final Exercise Prescription Changes):  Exercise Prescription Changes - 02/29/20 1630      Response to Exercise   Blood Pressure (Admit) 124/78    Blood Pressure (Exercise) 152/80    Blood Pressure (Exit) 142/74    Heart Rate (Admit) 63 bpm    Heart Rate (Exercise) 101 bpm    Heart Rate (Exit) 72 bpm    Rating of Perceived Exertion (Exercise) 9    Symptoms None    Comments Reviewed METS    Duration Continue with 30 min of aerobic exercise without signs/symptoms of physical distress.    Intensity THRR unchanged      Progression   Progression Continue to progress workloads to maintain intensity without signs/symptoms of physical distress.    Average METs 3.3      Resistance Training   Training Prescription Yes    Weight 3 lbs    Reps 10-15    Time 10 Minutes      Interval Training   Interval Training No      Treadmill   MPH 2.7    Grade 1    Minutes 15    METs 3.44      NuStep   Level 4    SPM 85    Minutes 15    METs 3.1      Home Exercise Plan   Plans to continue exercise at Home (comment)    Frequency Add 2 additional days to program exercise sessions.    Initial Home Exercises Provided 01/29/20           Nutrition:  Target Goals: Understanding of nutrition guidelines, daily intake of sodium <1513m, cholesterol <2060m calories 30% from fat and 7% or less from saturated fats, daily to have 5 or more servings of fruits and vegetables.  Biometrics:  Pre Biometrics - 01/05/20 1508      Pre Biometrics   Waist Circumference 42 inches    Hip Circumference 44 inches    Waist to Hip Ratio 0.95 %    Triceps Skinfold 28 mm    % Body Fat 41.2 %    Grip Strength 40 kg    Flexibility 14 in    Single Leg Stand 4.02 seconds            Nutrition Therapy Plan and Nutrition Goals:  Nutrition Therapy & Goals - 01/19/20 0756      Nutrition Therapy   Diet Heart healthy/carb mod    Drug/Food Interactions  Statins/Certain Fruits      Personal Nutrition Goals  Nutrition Goal Pt to build a healthy plate including vegetables, fruits, whole grains, and low-fat dairy products in a heart healthy meal plan.    Personal Goal #2 Pt to learn to read labels    Personal Goal #3 Pt to prepare easy meals for on-the-go      Intervention Plan   Intervention Prescribe, educate and counsel regarding individualized specific dietary modifications aiming towards targeted core components such as weight, hypertension, lipid management, diabetes, heart failure and other comorbidities.    Expected Outcomes Short Term Goal: A plan has been developed with personal nutrition goals set during dietitian appointment.;Long Term Goal: Adherence to prescribed nutrition plan.           Nutrition Assessments:  Nutrition Assessments - 01/26/20 1033      MEDFICTS Scores   Pre Score 10           Nutrition Goals Re-Evaluation:  Nutrition Goals Re-Evaluation    Row Name 01/19/20 0757 02/02/20 0904 03/02/20 1326         Goals   Current Weight 179 lb (81.2 kg) 174 lb 2.6 oz (79 kg) 174 lb 13.2 oz (79.3 kg)     Nutrition Goal Pt to build a healthy plate including vegetables, fruits, whole grains, and low-fat dairy products in a heart healthy meal plan. Pt to build a healthy plate including vegetables, fruits, whole grains, and low-fat dairy products in a heart healthy meal plan. Pt to build a healthy plate including vegetables, fruits, whole grains, and low-fat dairy products in a heart healthy meal plan.     Comment -- Reviewed label reading edu Reviewed cooking at home on the go       Personal Goal #2 Re-Evaluation   Personal Goal #2 Pt to learn to read labels Pt to learn to read labels Pt to learn to read labels       Personal Goal #3 Re-Evaluation   Personal Goal #3 Pt to prepare easy meals for on-the-go Pt to prepare easy meals for on-the-go Pt to prepare easy meals for on-the-go            Nutrition Goals  Discharge (Final Nutrition Goals Re-Evaluation):  Nutrition Goals Re-Evaluation - 03/02/20 1326      Goals   Current Weight 174 lb 13.2 oz (79.3 kg)    Nutrition Goal Pt to build a healthy plate including vegetables, fruits, whole grains, and low-fat dairy products in a heart healthy meal plan.    Comment Reviewed cooking at home on the go      Personal Goal #2 Re-Evaluation   Personal Goal #2 Pt to learn to read labels      Personal Goal #3 Re-Evaluation   Personal Goal #3 Pt to prepare easy meals for on-the-go           Psychosocial: Target Goals: Acknowledge presence or absence of significant depression and/or stress, maximize coping skills, provide positive support system. Participant is able to verbalize types and ability to use techniques and skills needed for reducing stress and depression.  Initial Review & Psychosocial Screening:  Initial Psych Review & Screening - 01/05/20 1609      Initial Review   Current issues with Current Stress Concerns    Source of Stress Concerns Family;Financial;Occupation;Chronic Illness    Comments Ms. Hinkson admits to current stressors including her health, recent loss of her husband unexpectedly 1 year ago, finances, and her job. 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 She attended grief  counseling through [facility] which was very beneficial for her. She admits to having a good support system and denies further interventions related to stress at this time.      Family Dynamics   Good Support System? Yes    Concerns Recent loss of significant other      Barriers   Psychosocial barriers to participate in program The patient should benefit from training in stress management and relaxation.      Screening Interventions   Interventions Encouraged to exercise;Provide feedback about the scores to participant           Quality of Life Scores:  Quality of Life - 02/29/20 1630      Quality of Life Scores   Health/Function  Post 25.93 %    Socioeconomic Post 25 %    Psych/Spiritual Post 25.43 %    Family Post 26.25 %    GLOBAL Post 25.66 %          Scores of 19 and below usually indicate a poorer quality of life in these areas.  A difference of  2-3 points is a clinically meaningful difference.  A difference of 2-3 points in the total score of the Quality of Life Index has been associated with significant improvement in overall quality of life, self-image, physical symptoms, and general health in studies assessing change in quality of life.  PHQ-9: Recent Review Flowsheet Data    Depression screen [facility] 2/9 02/18/2020 02/18/2020 01/05/2020 04/26/2014 01/13/2014   Decreased Interest 0 0 0 0 0   Down, Depressed, Hopeless 0 0 1 0 0   PHQ - 2 Score 0 0 1 0 0     Interpretation of Total Score  Total Score Depression Severity:  1-4 = Minimal depression, 5-9 = Mild depression, 10-14 = Moderate depression, 15-19 = Moderately severe depression, 20-27 = Severe depression   Psychosocial Evaluation and Intervention:   Psychosocial Re-Evaluation:  Psychosocial Re-Evaluation    Keeler Name 01/14/20 1406 02/04/20 1243 03/03/20 1647         Psychosocial Re-Evaluation   Current issues with Current Stress Concerns Current Stress Concerns Current Stress Concerns     Comments Harryette is still mourning the loss of her husband one year ago. Patie t does have her 3 sons for support Castella is still mourning the loss of her husband one year ago. Sharesa does have her 3 sons for support Cataleya has reported having a little less stress at work since having more staff to [employer]jel[employer]     Expected Outcomes Will continue to offer emotional support as needed Will continue to offer emotional support as needed Will continue to offer emotional support as needed     Interventions Encouraged to attend Cardiac Rehabilitation for the exercise;Stress management education Encouraged to attend Cardiac Rehabilitation for the exercise;Stress management education  Encouraged to attend Cardiac Rehabilitation for the exercise;Stress management education     Continue Psychosocial Services  Follow up required by staff Follow up required by staff No Follow up required     Comments -- Ms. Ucci admits to current stressors including her health, recent loss of her husband unexpectedly 1 year ago, finances, and her job. 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 She admits to having a good support system and denies further interventions related to stress at this time. Ms. Wilhide admits to current stressors including her health, recent loss of her husband unexpectedly 1  year ago, finances, and her job. 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 She attended grief counseling through [facility] which was very beneficial for her. She admits to having a good support system and denies further interventions related to stress at this time.       Initial Review   Source of Stress Concerns Family Family Family;Occupation            Psychosocial Discharge (Final Psychosocial Re-Evaluation):  Psychosocial Re-Evaluation - 03/03/20 1647      Psychosocial Re-Evaluation   Current issues with Current Stress Concerns    Comments Delmar has reported having a little less stress at work since having more staff to hjelp    Expected Outcomes Will continue to offer emotional support as needed    Interventions Encouraged to attend Cardiac Rehabilitation for the exercise;Stress management education    Continue Psychosocial Services  No Follow up required    Comments Ms. Griffo admits to current stressors including her health, recent loss of her husband unexpectedly 1 year ago, finances, and her job. 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 She attended grief counseling through [facility] which was very beneficial for her. She admits to having a good support system  and denies further interventions related to stress at this time.      Initial Review   Source of Stress Concerns Family;Occupation           Vocational Rehabilitation: Provide vocational rehab assistance to qualifying candidates.   Vocational Rehab Evaluation & Intervention:  Vocational Rehab - 01/05/20 1512      Initial Vocational Rehab Evaluation & Intervention   Assessment shows need for Vocational Rehabilitation No           Education: Education Goals: Education classes will be provided on a weekly basis, covering required topics. Participant will state understanding/return demonstration of topics presented.  Learning Barriers/Preferences:  Learning Barriers/Preferences - 01/05/20 1603      Learning Barriers/Preferences   Learning Barriers Sight   Waers glasses   Learning Preferences None           Education Topics: Hypertension, Hypertension Reduction -Define heart disease and high blood pressure. Discus how high blood pressure affects the body and ways to reduce high blood pressure.   Exercise and Your Heart -Discuss why it is important to exercise, the FITT principles of exercise, normal and abnormal responses to exercise, and how to exercise safely.   Angina -Discuss definition of angina, causes of angina, treatment of angina, and how to decrease risk of having angina.   Cardiac Medications -Review what the following cardiac medications are used for, how they affect the body, and side effects that may occur when taking the medications.  Medications include Aspirin, Beta blockers, calcium channel blockers, ACE Inhibitors, angiotensin receptor blockers, diuretics, digoxin, and antihyperlipidemics.   Congestive Heart Failure -Discuss the definition of CHF, how to live with CHF, the signs and symptoms of CHF, and how keep track of weight and sodium intake.   Heart Disease and Intimacy -Discus the effect sexual activity has on the heart, how changes occur  during intimacy as we age, and safety during sexual activity.   Smoking Cessation / COPD -Discuss different methods to quit smoking, the health benefits of quitting smoking, and the definition of COPD.   Nutrition I: Fats -Discuss the types of cholesterol, what cholesterol does to the heart, and how cholesterol levels can be controlled.   Nutrition II: Labels -Discuss the different  components of food labels and how to read food label   Heart Parts/Heart Disease and PAD -Discuss the anatomy of the heart, the pathway of blood circulation through the heart, and these are affected by heart disease.   Stress I: Signs and Symptoms -Discuss the causes of stress, how stress may lead to anxiety and depression, and ways to limit stress.   Stress II: Relaxation -Discuss different types of relaxation techniques to limit stress.   Warning Signs of Stroke / TIA -Discuss definition of a stroke, what the signs and symptoms are of a stroke, and how to identify when someone is having stroke.   Knowledge Questionnaire Score:  Knowledge Questionnaire Score - 02/29/20 1630      Knowledge Questionnaire Score   Post Score 23/24           Core Components/Risk Factors/Patient Goals at Admission:  Personal Goals and Risk Factors at Admission - 01/05/20 1604      Core Components/Risk Factors/Patient Goals on Admission    Weight Management Yes;Weight Loss    Admit Weight 175 lb 7.8 oz (79.6 kg)    Expected Outcomes Short Term: Continue to assess and modify interventions until short term weight is achieved;Long Term: Adherence to nutrition and physical activity/exercise program aimed toward attainment of established weight goal;Weight Maintenance: Understanding of the daily nutrition guidelines, which includes 25-35% calories from fat, 7% or less cal from saturated fats, less than 249m cholesterol, less than 1.5gm of sodium, & 5 or more servings of fruits and vegetables daily;Weight Loss:  Understanding of general recommendations for a balanced deficit meal plan, which promotes 1-2 lb weight loss per week and includes a negative energy balance of [phone] kcal/d;Understanding recommendations for meals to include 15-35% energy as protein, 25-35% energy from fat, 35-60% energy from carbohydrates, less than 20107mof dietary cholesterol, 20-35 gm of total fiber daily;Understanding of distribution of calorie intake throughout the day with the consumption of 4-5 meals/snacks    Diabetes Yes    Intervention Provide education about signs/symptoms and action to take for hypo/hyperglycemia.;Provide education about proper nutrition, including hydration, and aerobic/resistive exercise prescription along with prescribed medications to achieve blood glucose in normal ranges: Fasting glucose 65-99 mg/dL    Expected Outcomes Short Term: Participant verbalizes understanding of the signs/symptoms and immediate care of hyper/hypoglycemia, proper foot care and importance of medication, aerobic/resistive exercise and nutrition plan for blood glucose control.;Long Term: Attainment of HbA1C < 7%.    Hypertension Yes    Intervention Provide education on lifestyle modifcations including regular physical activity/exercise, weight management, moderate sodium restriction and increased consumption of fresh fruit, vegetables, and low fat dairy, alcohol moderation, and smoking cessation.;Monitor prescription use compliance.    Expected Outcomes Short Term: Continued assessment and intervention until BP is < 140/9064mG in hypertensive participants. < 130/57m17m in hypertensive participants with diabetes, heart failure or chronic kidney disease.;Long Term: Maintenance of blood pressure at goal levels.    Lipids Yes    Intervention Provide education and support for participant on nutrition & aerobic/resistive exercise along with prescribed medications to achieve LDL <70mg42mL >40mg.67mExpected Outcomes Short Term:  Participant states understanding of desired cholesterol values and is compliant with medications prescribed. Participant is following exercise prescription and nutrition guidelines.;Long Term: Cholesterol controlled with medications as prescribed, with individualized exercise RX and with personalized nutrition plan. Value goals: LDL < 70mg, 45m> 40 mg.    Stress Yes    Intervention Offer individual and/or small group  education and counseling on adjustment to heart disease, stress management and health-related lifestyle change. Teach and support self-help strategies.;Refer participants experiencing significant psychosocial distress to appropriate mental health specialists for further evaluation and treatment. When possible, include family members and significant others in education/counseling sessions.    Expected Outcomes Short Term: Participant demonstrates changes in health-related behavior, relaxation and other stress management skills, ability to obtain effective social support, and compliance with psychotropic medications if prescribed.;Long Term: Emotional wellbeing is indicated by absence of clinically significant psychosocial distress or social isolation.           Core Components/Risk Factors/Patient Goals Review:   Goals and Risk Factor Review    Row Name 01/14/20 1408 02/04/20 1244 03/03/20 1648         Core Components/Risk Factors/Patient Goals Review   Personal Goals Review Weight Management/Obesity;Stress;Hypertension;Diabetes;Lipids Weight Management/Obesity;Stress;Hypertension;Diabetes;Lipids Weight Management/Obesity;Stress;Hypertension;Diabetes;Lipids     Review Aarion started exercise on 01/14/20 and did well with exercise. Lenea vital signs and CBG's were stable Adelene has been doing well with exercise. Kenesha did has some intermittent exertional blood pressures that were forwarded to Dr Marisue Ivan. CBG's have been stable. Vieno has been doing well with exercise. Onalee did has some intermittent  exertional blood pressures that were forwarded to Dr Marisue Ivan. CBG's have been stable.     Expected Outcomes Patient will continue to participate in phase 2 cardiac rehab for exercise, nutrition and lifstyle modifications Patient will continue to participate in phase 2 cardiac rehab for exercise, nutrition and lifstyle modifications Patient will continue to participate in phase 2 cardiac rehab for exercise, nutrition and lifstyle modifications            Core Components/Risk Factors/Patient Goals at Discharge (Final Review):   Goals and Risk Factor Review - 03/03/20 1648      Core Components/Risk Factors/Patient Goals Review   Personal Goals Review Weight Management/Obesity;Stress;Hypertension;Diabetes;Lipids    Review Desteny has been doing well with exercise. Stormie did has some intermittent exertional blood pressures that were forwarded to Dr Marisue Ivan. CBG's have been stable.    Expected Outcomes Patient will continue to participate in phase 2 cardiac rehab for exercise, nutrition and lifstyle modifications           ITP Comments:  ITP Comments    Row Name 01/05/20 1504 01/14/20 1405 02/04/20 1241 03/03/20 1647     ITP Comments Dr. Fransico Him, Medical Director [facility] [place] 30 Day ITP Review. Trezure started exercise on 01/14/20 and did well with exercise 30 Day ITP Review. Hermena has good attendance and participation in phase 2 cardiac rehab. 30 Day ITP Review. Tashawna has good attendance and participation in phase 2 cardiac rehab.           Comments: See ITP comments. Jahmia will complete cardiac rehab on 03/03/20.Barnet Pall, RN,BSN 03/03/2020 4:50 PM

## 2020-03-04 ENCOUNTER — Other Ambulatory Visit: Payer: Self-pay

## 2020-03-04 ENCOUNTER — Encounter (HOSPITAL_COMMUNITY)
Admission: RE | Admit: 2020-03-04 | Discharge: 2020-03-04 | Disposition: A | Discharge status: Home / Self Care | Payer: BC Managed Care – PPO | Source: Ambulatory Visit | Attending: Internal Medicine | Admitting: Internal Medicine

## 2020-03-04 VITALS — Ht 65.75 in | Wt 175.0 lb

## 2020-03-04 DIAGNOSIS — I2111 ST elevation (STEMI) myocardial infarction involving right coronary artery: Secondary | ICD-10-CM

## 2020-03-04 NOTE — Progress Notes (Signed)
Discharge Progress Report  Patient Details  Name: Sarah Ali MRN: [id number] Date of Birth: 12-Nov-1955 Referring Provider:     CARDIAC REHAB PHASE II ORIENTATION from 01/05/2020 in [place]  Referring Provider End, Harrell Gave       Number of Visits: 17  Reason for Discharge:  Patient reached a stable level of exercise. Patient has met program and personal goals.  Smoking History:  Social History   Tobacco Use  Smoking Status Never Smoker  Smokeless Tobacco Never Used    Diagnosis:  ST elevation myocardial infarction involving right coronary artery ([place])  ADL [facility]:   Initial Exercise Prescription:  Initial Exercise Prescription - 01/05/20 1500      Date of Initial Exercise RX and Referring Provider   Date 01/05/20    Referring Provider End, Harrell Gave    Expected Discharge Date 03/04/20      Treadmill   MPH 2.5    Grade 0    Minutes 15    METs 2.91      NuStep   Level 2    SPM 75    Minutes 15    METs 2.2      Prescription Details   Frequency (times per week) 3    Duration Progress to 30 minutes of continuous aerobic without signs/symptoms of physical distress      Intensity   THRR 40-80% of Max Heartrate 64-127    Ratings of Perceived Exertion 11-13    Perceived Dyspnea 0-4      Progression   Progression Continue progressive overload as per policy without signs/symptoms or physical distress.      Resistance Training   Training Prescription Yes    Weight 3 lbs    Reps 10-15           Discharge Exercise Prescription (Final Exercise Prescription Changes):  Exercise Prescription Changes - 02/29/20 1630      Response to Exercise   Blood Pressure (Admit) 124/78    Blood Pressure (Exercise) 152/80    Blood Pressure (Exit) 142/74    Heart Rate (Admit) 63 bpm    Heart Rate (Exercise) 101 bpm    Heart Rate (Exit) 72 bpm    Rating of Perceived Exertion (Exercise) 9    Symptoms None    Comments Reviewed METS     Duration Continue with 30 min of aerobic exercise without signs/symptoms of physical distress.    Intensity THRR unchanged      Progression   Progression Continue to progress workloads to maintain intensity without signs/symptoms of physical distress.    Average METs 3.3      Resistance Training   Training Prescription Yes    Weight 3 lbs    Reps 10-15    Time 10 Minutes      Interval Training   Interval Training No      Treadmill   MPH 2.7    Grade 1    Minutes 15    METs 3.44      NuStep   Level 4    SPM 85    Minutes 15    METs 3.1      Home Exercise Plan   Plans to continue exercise at Home (comment)    Frequency Add 2 additional days to program exercise sessions.    Initial Home Exercises Provided 01/29/20           Functional Capacity:  6 Minute Walk    Row Name 01/05/20 1540 02/29/20 1520  02/29/20 1520     6 Minute Walk   Phase Initial Discharge Discharge (P)    Distance 1702 feet 1742 feet 1742 feet (P)    Distance Feet Change -- 40 ft 40 ft (P)    Walk Time 6 minutes 6 minutes 6 minutes (P)    # of Rest Breaks 0 0 0 (P)    MPH 3.22 3.3 --   METS 4.21 4.21 --   RPE 11 8 --   Perceived Dyspnea  1 0 --   VO2 Peak 14.72 14.72 --   Symptoms Yes (comment) No --   Comments Mild SOB, RPD = 1 -- --   Resting HR 70 bpm 63 bpm --   Resting BP 158/82 124/78 --   Resting Oxygen Saturation  96 % 97 % --   Exercise Oxygen Saturation  during 6 min walk 97 % 97 % --   Max Ex. HR 97 bpm 97 bpm --   Max Ex. BP 162/86 152/80 --   2 Minute Post BP 140/72 -- --          Psychological, QOL, Others - Outcomes: PHQ 2/9: Depression screen [facility] 2/9 03/15/2020 02/18/2020 02/18/2020 01/05/2020 04/26/2014  Decreased Interest 0 0 0 0 0  Down, Depressed, Hopeless 0 0 0 1 0  PHQ - 2 Score 0 0 0 1 0    Quality of Life:  Quality of Life - 02/29/20 1630      Quality of Life Scores   Health/Function Post 25.93 %    Socioeconomic Post 25 %    Psych/Spiritual Post  25.43 %    Family Post 26.25 %    GLOBAL Post 25.66 %           Personal Goals: Goals established at orientation with interventions provided to work toward goal.  Personal Goals and Risk Factors at Admission - 01/05/20 1604      Core Components/Risk Factors/Patient Goals on Admission    Weight Management Yes;Weight Loss    Admit Weight 175 lb 7.8 oz (79.6 kg)    Expected Outcomes Short Term: Continue to assess and modify interventions until short term weight is achieved;Long Term: Adherence to nutrition and physical activity/exercise program aimed toward attainment of established weight goal;Weight Maintenance: Understanding of the daily nutrition guidelines, which includes 25-35% calories from fat, 7% or less cal from saturated fats, less than 270m cholesterol, less than 1.5gm of sodium, & 5 or more servings of fruits and vegetables daily;Weight Loss: Understanding of general recommendations for a balanced deficit meal plan, which promotes 1-2 lb weight loss per week and includes a negative energy balance of [phone] kcal/d;Understanding recommendations for meals to include 15-35% energy as protein, 25-35% energy from fat, 35-60% energy from carbohydrates, less than 2031mof dietary cholesterol, 20-35 gm of total fiber daily;Understanding of distribution of calorie intake throughout the day with the consumption of 4-5 meals/snacks    Diabetes Yes    Intervention Provide education about signs/symptoms and action to take for hypo/hyperglycemia.;Provide education about proper nutrition, including hydration, and aerobic/resistive exercise prescription along with prescribed medications to achieve blood glucose in normal ranges: Fasting glucose 65-99 mg/dL    Expected Outcomes Short Term: Participant verbalizes understanding of the signs/symptoms and immediate care of hyper/hypoglycemia, proper foot care and importance of medication, aerobic/resistive exercise and nutrition plan for blood glucose  control.;Long Term: Attainment of HbA1C < 7%.    Hypertension Yes    Intervention Provide education on lifestyle modifcations including regular physical  activity/exercise, weight management, moderate sodium restriction and increased consumption of fresh fruit, vegetables, and low fat dairy, alcohol moderation, and smoking cessation.;Monitor prescription use compliance.    Expected Outcomes Short Term: Continued assessment and intervention until BP is < 140/52m HG in hypertensive participants. < 130/820mHG in hypertensive participants with diabetes, heart failure or chronic kidney disease.;Long Term: Maintenance of blood pressure at goal levels.    Lipids Yes    Intervention Provide education and support for participant on nutrition & aerobic/resistive exercise along with prescribed medications to achieve LDL <7044mHDL >27m32m  Expected Outcomes Short Term: Participant states understanding of desired cholesterol values and is compliant with medications prescribed. Participant is following exercise prescription and nutrition guidelines.;Long Term: Cholesterol controlled with medications as prescribed, with individualized exercise RX and with personalized nutrition plan. Value goals: LDL < 70mg99mL > 40 mg.    Stress Yes    Intervention Offer individual and/or small group education and counseling on adjustment to heart disease, stress management and health-related lifestyle change. Teach and support self-help strategies.;Refer participants experiencing significant psychosocial distress to appropriate mental health specialists for further evaluation and treatment. When possible, include family members and significant others in education/counseling sessions.    Expected Outcomes Short Term: Participant demonstrates changes in health-related behavior, relaxation and other stress management skills, ability to obtain effective social support, and compliance with psychotropic medications if prescribed.;Long  Term: Emotional wellbeing is indicated by absence of clinically significant psychosocial distress or social isolation.            Personal Goals Discharge:  Goals and Risk Factor Review    Row Name 01/14/20 1408 02/04/20 1244 03/03/20 1648 03/15/20 1331 03/16/20 1254     Core Components/Risk Factors/Patient Goals Review   Personal Goals Review Weight Management/Obesity;Stress;Hypertension;Diabetes;Lipids Weight Management/Obesity;Stress;Hypertension;Diabetes;Lipids Weight Management/Obesity;Stress;Hypertension;Diabetes;Lipids Weight Management/Obesity;Stress;Hypertension;Diabetes;Lipids (P)  Weight Management/Obesity;Stress;Hypertension;Diabetes;Lipids   Review Kymia Byrdieted exercise on 01/14/20 and did well with exercise. Edris vital signs and CBG's were stable Denaly Ardycebeen doing well with exercise. Shalom Sandihas some intermittent exertional blood pressures that were forwarded to Dr OnealMarisue Ivan's have been stable. Kayleanna Alajahbeen doing well with exercise. Monnica Shatericahas some intermittent exertional blood pressures that were forwarded to Dr OnealMarisue Ivan's have been stable. Salle Tannaleted phase 2 cardiac rehab on 03/04/20 CBG's have been stable. (P)  Vail completed exercise at cardiac rehab on 03/04/20. Evadean Browniewell with exercise. Shasta Maeghancontinue to have some intermittent BP elevations will forward to Dr ONealMarisue Ivanpected Outcomes Patient will continue to participate in phase 2 cardiac rehab for exercise, nutrition and lifstyle modifications Patient will continue to participate in phase 2 cardiac rehab for exercise, nutrition and lifstyle modifications Patient will continue to participate in phase 2 cardiac rehab for exercise, nutrition and lifstyle modifications -- Jaelah Melvine continue to exercise upon completion of phase 2, follow nutrition and lifestyle modifications          Exercise Goals and Review:  Exercise Goals    Row Name 01/05/20 1545             Exercise Goals   Increase Physical  Activity Yes       Intervention Provide advice, education, support and counseling about physical activity/exercise needs.;Develop an individualized exercise prescription for aerobic and resistive training based on initial evaluation findings, risk stratification, comorbidities and participant's personal goals.       Expected Outcomes Short Term: Attend rehab on a regular basis to increase amount of physical  activity.;Long Term: Add in home exercise to make exercise part of routine and to increase amount of physical activity.;Long Term: Exercising regularly at least 3-5 days a week.       Increase Strength and Stamina Yes       Intervention Provide advice, education, support and counseling about physical activity/exercise needs.;Develop an individualized exercise prescription for aerobic and resistive training based on initial evaluation findings, risk stratification, comorbidities and participant's personal goals.       Expected Outcomes Short Term: Increase workloads from initial exercise prescription for resistance, speed, and METs.;Short Term: Perform resistance training exercises routinely during rehab and add in resistance training at home;Long Term: Improve cardiorespiratory fitness, muscular endurance and strength as measured by increased METs and functional capacity (6MWT)       Able to understand and use rate of perceived exertion (RPE) scale Yes       Intervention Provide education and explanation on how to use RPE scale       Expected Outcomes Short Term: Able to use RPE daily in rehab to express subjective intensity level;Long Term:  Able to use RPE to guide intensity level when exercising independently       Knowledge and understanding of Target Heart Rate Range (THRR) Yes       Intervention Provide education and explanation of THRR including how the numbers were predicted and where they are located for reference       Expected Outcomes Short Term: Able to state/look up THRR;Short Term: Able  to use daily as guideline for intensity in rehab;Long Term: Able to use THRR to govern intensity when exercising independently       Able to check pulse independently Yes       Intervention Provide education and demonstration on how to check pulse in carotid and radial arteries.;Review the importance of being able to check your own pulse for safety during independent exercise       Expected Outcomes Short Term: Able to explain why pulse checking is important during independent exercise;Long Term: Able to check pulse independently and accurately       Understanding of Exercise Prescription Yes       Intervention Provide education, explanation, and written materials on patient's individual exercise prescription       Expected Outcomes Short Term: Able to explain program exercise prescription;Long Term: Able to explain home exercise prescription to exercise independently              Exercise Goals Re-Evaluation:  Exercise Goals Re-Evaluation    Row Name 01/13/20 1630 01/29/20 1620 02/15/20 1630         Exercise Goal Re-Evaluation   Exercise Goals Review Increase Physical Activity;Increase Strength and Stamina;Able to understand and use rate of perceived exertion (RPE) scale;Knowledge and understanding of Target Heart Rate Range (THRR);Able to check pulse independently;Understanding of Exercise Prescription Increase Physical Activity;Increase Strength and Stamina;Able to understand and use rate of perceived exertion (RPE) scale;Knowledge and understanding of Target Heart Rate Range (THRR);Able to check pulse independently;Understanding of Exercise Prescription Increase Physical Activity;Increase Strength and Stamina;Able to understand and use rate of perceived exertion (RPE) scale;Knowledge and understanding of Target Heart Rate Range (THRR);Able to check pulse independently;Understanding of Exercise Prescription     Comments Pt's first day in the CRP2 program. Pt tolerated exercise session well  with no compalints. Pt understands Exercise RX, RPE scale and THRR. Reviewed home exercise program wtih patient today. Pt voices she would like to replace her existing treadmill so she  can walk on it at home. Any addtional walking encouraged 2x/week. Pt is very challenged by her work schedule. Pt verbalized understnading of the home exercise Rx and was provided aa copy. Reviewed METs and Goals. Pt is making good progress in the CRP2 program. Pt still has not been able to replace her treadmill at home. Continue to encouraged supplemental walking.     Expected Outcomes Will continue to monitor and progress patient as tolerated. Pt will add walking at home for 30 minutes 2x/week. Continue to monitor and progress patient as tolerated.            Nutrition & Weight - Outcomes:  Pre Biometrics - 01/05/20 1508      Pre Biometrics   Waist Circumference 42 inches    Hip Circumference 44 inches    Waist to Hip Ratio 0.95 %    Triceps Skinfold 28 mm    % Body Fat 41.2 %    Grip Strength 40 kg    Flexibility 14 in    Single Leg Stand 4.02 seconds           Post Biometrics - 03/04/20 1620       Post  Biometrics   Height 5' 5.75" (1.67 m)    Weight 79.4 kg    Waist Circumference 41 inches    Hip Circumference 44 inches    Waist to Hip Ratio 0.93 %    BMI (Calculated) 28.47    Triceps Skinfold 27 mm    % Body Fat 40.7 %    Grip Strength 38 kg    Flexibility 14.25 in    Single Leg Stand 30 seconds           Nutrition:  Nutrition Therapy & Goals - 01/19/20 0756      Nutrition Therapy   Diet Heart healthy/carb mod    Drug/Food Interactions Statins/Certain Fruits      Personal Nutrition Goals   Nutrition Goal Pt to build a healthy plate including vegetables, fruits, whole grains, and low-fat dairy products in a heart healthy meal plan.    Personal Goal #2 Pt to learn to read labels    Personal Goal #3 Pt to prepare easy meals for on-the-go      Intervention Plan   Intervention  Prescribe, educate and counsel regarding individualized specific dietary modifications aiming towards targeted core components such as weight, hypertension, lipid management, diabetes, heart failure and other comorbidities.    Expected Outcomes Short Term Goal: A plan has been developed with personal nutrition goals set during dietitian appointment.;Long Term Goal: Adherence to prescribed nutrition plan.           Nutrition Discharge:  Nutrition Assessments - 03/08/20 0903      MEDFICTS Scores   Post Score 18           Education Questionnaire Score:  Knowledge Questionnaire Score - 02/29/20 1630      Knowledge Questionnaire Score   Post Score 23/24           Goals reviewed with patient; copy given to patient.Latorsha graduated from cardiac rehab program today with completion of 17 exercise sessions in Phase II. Pt maintained good attendance and progressed nicely during his participation in rehab as evidenced by increased MET level.   Medication list reconciled. Repeat  PHQ score- 0 . Ciara has made significant lifestyle changes and should be commended for her success. Pt feels she has achieved her goals during cardiac rehab.   Pt plans to continue  exercise by walking and is considering getting a step tracker in the near future. Daniesha increased her distance on her post exercise walk test by 40 feet. We are proud of Breshae's progress.Barnet Pall, RN,BSN 03/16/2020 1:03 PM

## 2020-03-16 NOTE — Progress Notes (Signed)
Thanks

## 2020-03-29 DIAGNOSIS — E782 Mixed hyperlipidemia: Secondary | ICD-10-CM | POA: Diagnosis not present

## 2020-03-29 DIAGNOSIS — I10 Essential (primary) hypertension: Secondary | ICD-10-CM | POA: Diagnosis not present

## 2020-03-29 DIAGNOSIS — I2510 Atherosclerotic heart disease of native coronary artery without angina pectoris: Secondary | ICD-10-CM | POA: Diagnosis not present

## 2020-03-29 DIAGNOSIS — E1165 Type 2 diabetes mellitus with hyperglycemia: Secondary | ICD-10-CM | POA: Diagnosis not present

## 2020-04-07 DIAGNOSIS — E782 Mixed hyperlipidemia: Secondary | ICD-10-CM | POA: Diagnosis not present

## 2020-04-07 DIAGNOSIS — E1165 Type 2 diabetes mellitus with hyperglycemia: Secondary | ICD-10-CM | POA: Diagnosis not present

## 2020-04-07 DIAGNOSIS — I2510 Atherosclerotic heart disease of native coronary artery without angina pectoris: Secondary | ICD-10-CM | POA: Diagnosis not present

## 2020-04-07 DIAGNOSIS — Z0000 Encounter for general adult medical examination without abnormal findings: Secondary | ICD-10-CM | POA: Diagnosis not present

## 2020-04-11 DIAGNOSIS — I2510 Atherosclerotic heart disease of native coronary artery without angina pectoris: Secondary | ICD-10-CM | POA: Diagnosis not present

## 2020-04-11 DIAGNOSIS — Z23 Encounter for immunization: Secondary | ICD-10-CM | POA: Diagnosis not present

## 2020-04-11 DIAGNOSIS — I10 Essential (primary) hypertension: Secondary | ICD-10-CM | POA: Diagnosis not present

## 2020-04-12 NOTE — Progress Notes (Signed)
Cardiology Office Note:   Date:  04/15/2020  NAME:  Sarah Ali    MRN: [id number] DOB:  07/22/1955   PCP:  Ramachandran, Ajith, MD  Cardiologist:  Gnadenhutten T O'Neal, MD   Referring MD: Ramachandran, Ajith, MD   Chief Complaint  Patient presents with  . Follow-up    3 months.  . Coronary Artery Disease   History of Present Illness:   Sarah Ali is a 64 y.o. female with a hx of CAD s/p PCI, DM, HLD who presents for follow-up. LDL was not at goal. Zetia added.  Most recent LDL cholesterol still not at goal.  She reports she is not exercising.  She did graduate from cardiac rehab.  Has no chest pain or shortness of breath.  We did discuss that she needs to start exercising more.  Most recent A1c 5.9.  Triglycerides also a bit elevated.  Blood pressure 156/84.  She reports it is within normal limits when checked at home.  I would like for her to do this more often.  No bleeding issues on aspirin and Effient.  We did discuss that her cholesterol needs to get a bit lower.  We discussed Repatha versus Nexletol.  She is interested in meeting with the pharmacist to discuss cost as well as learn more about Repatha.  We also discussed Vascepa for triglycerides.  She reports she would like to start 1 medication before he had another.  I do agree with this.  We also discussed proper diet.  She will start to work on this.  Overall doing well without any symptoms.  Mainstay of our treatment is secondary prevention.  Problem List 1. STEMI -11/2019  -DES to d RCA (occluded old stent) -60% mid LAD -40% LCX -post infarct pericarditis 2. DM -A1c 5.9 3. HLD -LDL 78, triglycerides 172, total cholesterol 162, HDL 50  Past Medical History: Past Medical History:  Diagnosis Date  . Asthma    childhood  . BRCA1 positive   . Bronchitis   . Diabetes mellitus without complication (HCC)   . Eczema   . Family history of breast cancer   . Family history of ovarian cancer   . High cholesterol   .  Hypertension   . Pneumonia     Past Surgical History: Past Surgical History:  Procedure Laterality Date  . BREAST BIOPSY    . BREAST SURGERY  07/05/2016   biopsy  . COLONOSCOPY    . CORONARY ANGIOPLASTY  2015  . CORONARY/GRAFT ACUTE MI REVASCULARIZATION N/A 11/16/2019   Procedure: Coronary/Graft Acute MI Revascularization;  Surgeon: End, Christopher, MD;  Location: [facility];  Service: Cardiovascular;  Laterality: N/A;  . DILATATION & CURRETTAGE/HYSTEROSCOPY WITH RESECTOCOPE N/A 07/19/2016   Procedure: DILATATION & CURETTAGE/HYSTEROSCOPY WITH RESECTION OF ENDOMETRIAL POLYP WITH MYOSURE;  Surgeon: Sheronette Cousins, MD;  Location: [facility];  Service: Gynecology;  Laterality: N/A;  . LEFT HEART CATH AND CORONARY ANGIOGRAPHY N/A 11/16/2019   Procedure: LEFT HEART CATH AND CORONARY ANGIOGRAPHY;  Surgeon: End, Christopher, MD;  Location: [facility];  Service: Cardiovascular;  Laterality: N/A;  . ROBOTIC ASSISTED BILATERAL SALPINGO OOPHERECTOMY Bilateral 07/19/2016   Procedure: ROBOTIC ASSISTED BILATERAL SALPINGO OOPHORECTOMY AND LYSIS OF ADHESIONS, PELVIC WASHINGS;  Surgeon: Sheronette Cousins, MD;  Location: [facility];  Service: Gynecology;  Laterality: Bilateral;  DO NOT DRAPE THE ROBOT     Current Medications: Current Meds  Medication Sig  . aspirin EC 81 MG tablet Take 1 tablet (81 mg total) by mouth   daily. Swallow whole.  . atorvastatin (LIPITOR) 80 MG tablet Take 1 tablet (80 mg total) by mouth daily at 6 PM.  . carvedilol (COREG) 12.5 MG tablet Take 1 tablet (12.5 mg total) by mouth 2 (two) times daily.  . Cholecalciferol (VITAMIN D) 2000 units tablet Take 2,000 Units by mouth daily.  . ezetimibe (ZETIA) 10 MG tablet Take 1 tablet (10 mg total) by mouth daily.  . JANUVIA 100 MG tablet Take 100 mg by mouth daily.  . metFORMIN (GLUCOPHAGE-XR) 500 MG 24 hr tablet Take 1,000 mg by mouth 2 (two) times daily.  . Multiple Vitamins-Minerals (MULTIVITAMIN WITH MINERALS) tablet Take  1 tablet by mouth daily.  . nitroGLYCERIN (NITROSTAT) 0.4 MG SL tablet Place 1 tablet (0.4 mg total) under the tongue every 5 (five) minutes as needed for chest pain.  . olmesartan (BENICAR) 40 MG tablet Take 40 mg by mouth daily.  . prasugrel (EFFIENT) 10 MG TABS tablet Take 1 tablet (10 mg total) by mouth daily.  . vitamin E 400 UNIT capsule Take 400 Units by mouth daily.     Allergies:    Patient has no known allergies.   Social History: Social History   Socioeconomic History  . Marital status: Widowed    Spouse name: Not on file  . Number of children: 3  . Years of education: Not on file  . Highest education level: Not on file  Occupational History  . Not on file  Tobacco Use  . Smoking status: Never Smoker  . Smokeless tobacco: Never Used  Substance and Sexual Activity  . Alcohol use: Yes    Comment: occ  . Drug use: No  . Sexual activity: Not on file  Other Topics Concern  . Not on file  Social History Narrative  . Not on file   Social Determinants of Health   Financial Resource Strain: Not on file  Food Insecurity: Not on file  Transportation Needs: Not on file  Physical Activity: Not on file  Stress: Not on file  Social Connections: Not on file     Family History: The patient's family history includes BRCA 1/2 in her brother and maternal aunt; Breast cancer in her maternal aunt and maternal grandmother; Colon cancer in her paternal aunt; Colon cancer (age of onset: 80) in her mother; Ovarian cancer (age of onset: 80) in her mother; Parkinson's disease (age of onset: 40) in her maternal grandfather; Skin cancer in her father.  ROS:   All other ROS reviewed and negative. Pertinent positives noted in the HPI.     EKGs/Labs/Other Studies Reviewed:   The following studies were personally reviewed by me today:  LHC 11/16/2019 1. Severe single-vessel coronary artery disease with thrombotic occlusion of the distal RCA involving the distal segment of old stent as  well as unstented vessel beyond the stent.  There is also 80% stenosis involving the ostium of the RPDA. 2. Moderate, noncritical disease involving the mid LAD, mid LCx, and proximal RCA of up to 50-60%. 3. Low normal left ventricular contraction with basal and mid inferior hypokinesis.  LVEF 50-55%. 4. Mildly elevated left ventricular filling pressure. 5. Successful PCI to distal RCA using a Synergy 2.5 x 24 mm drug-eluting stent extending from the distal half of the old stent to just before the bifurcation (postdilated to 2.9 mm) with 0% residual stenosis and TIMI-3 flow. 6. Successful PTCA to the ostium of RCA using Emerge 2.0 x 12 mm balloon with reduction of stenosis from 80% to   20%.   TTE 11/16/2019 1. Normal LV function; grade 1 diastolic dysfunction; mild LVH; small  pericardial effusion.  2. Left ventricular ejection fraction, by estimation, is 55 to 60%. The  left ventricle has normal function. The left ventricle has no regional  wall motion abnormalities. There is mild left ventricular hypertrophy.  Left ventricular diastolic parameters  are consistent with Grade I diastolic dysfunction (impaired relaxation).  3. Right ventricular systolic function is normal. The right ventricular  size is normal.  4. The mitral valve is normal in structure. No evidence of mitral valve  regurgitation. No evidence of mitral stenosis.  5. The aortic valve is tricuspid. Aortic valve regurgitation is not  visualized. No aortic stenosis is present.  6. The inferior vena cava is normal in size with greater than 50%  respiratory variability, suggesting right atrial pressure of 3 mmHg.   Recent Labs: 11/18/2019: BUN 19; Creatinine, Ser 0.93; Hemoglobin 12.0; Platelets 146; Potassium 4.1; Sodium 139 01/08/2020: ALT 51   Recent Lipid Panel    Component Value Date/Time   CHOL 152 01/08/2020 0826   TRIG 139 01/08/2020 0826   HDL 42 01/08/2020 0826   CHOLHDL 3.6 01/08/2020 0826   CHOLHDL 4.2  11/16/2019 1102   VLDL 57 (H) 11/16/2019 1102   LDLCALC 85 01/08/2020 0826    Physical Exam:   VS:  BP (!) 156/84 (BP Location: Left Arm, Patient Position: Sitting, Cuff Size: Normal)   Pulse 68   Ht 5' 6" (1.676 m)   Wt 178 lb (80.7 kg)   BMI 28.73 kg/m    Wt Readings from Last 3 Encounters:  04/15/20 178 lb (80.7 kg)  03/04/20 175 lb 0.7 oz (79.4 kg)  01/15/20 179 lb 9.6 oz (81.5 kg)    General: Well nourished, well developed, in no acute distress Head: Atraumatic, normal size  Eyes: PEERLA, EOMI  Neck: Supple, no JVD Endocrine: No thryomegaly Cardiac: Normal S1, S2; RRR; no murmurs, rubs, or gallops Lungs: Clear to auscultation bilaterally, no wheezing, rhonchi or rales  Abd: Soft, nontender, no hepatomegaly  Ext: No edema, pulses 2+ Musculoskeletal: No deformities, BUE and BLE strength normal and equal Skin: Warm and dry, no rashes   Neuro: Alert and oriented to person, place, time, and situation, CNII-XII grossly intact, no focal deficits  Psych: Normal mood and affect   ASSESSMENT:   Chantee Fotopoulos is a 64 y.o. female who presents for the following: 1. Coronary artery disease involving native coronary artery of native heart without angina pectoris   2. Mixed hyperlipidemia   3. Essential hypertension     PLAN:   1. Coronary artery disease involving native coronary artery of native heart without angina pectoris 2. Mixed hyperlipidemia -STEMI in July 2021.  Drug-eluting stent to the distal RCA.  Had prior old stent that was occluded.  Nonobstructive disease in the mid LAD and left circumflex.  She will continue aspirin and Effient for 1 year.  We will then drop Effient. -Blood pressure elevated.  She will start to keep a log.  Apparently it is better controlled at home. -On Lipitor and Zetia.  Most recent LDL 78.  We will refer her to the pharmacy clinic for PCSK9 inhibitor therapy.  She will qualify for secondary prevention. -We discussed Vascepa and getting her  triglycerides less than 150.  She would like to start with Repatha and then we can see what her repeat levels are.  She may end up needing Vascepa as well given her diabetes as   well as CAD. -Most recent A1c 5.9.  She should continue with strict glycemic control. -Exercise regimen discussed in office.  Counseled on proper diet.  3. Essential hypertension -BP not at goal.  She reports it is well controlled at home.  She will start to keep a log.  Disposition: Return in about 6 months (around 10/14/2020).  Medication Adjustments/Labs and Tests Ordered: Current medicines are reviewed at length with the patient today.  Concerns regarding medicines are outlined above.  No orders of the defined types were placed in this encounter.  No orders of the defined types were placed in this encounter.   Patient Instructions  Medication Instructions:  We will be referring you to our Pharmacist team for medication to manage for hyperlipidemia. We will be in touch with you with more information. *If you need a refill on your cardiac medications before your next appointment, please call your pharmacy*   Lab Work: None ordered If you have labs (blood work) drawn today and your tests are completely normal, you will receive your results only by: Marland Kitchen MyChart Message (if you have MyChart) OR . A paper copy in the mail If you have any lab test that is abnormal or we need to change your treatment, we will call you to review the results.   Testing/Procedures: None ordered   Follow-Up: At [facility], you and your health needs are our priority.  As part of our continuing mission to provide you with exceptional heart care, we have created designated Provider Care Teams.  These Care Teams include your primary Cardiologist (physician) and Advanced Practice Providers (APPs -  Physician Assistants and Nurse Practitioners) who all work together to provide you with the care you need, when you need it.  We recommend  signing up for the patient portal called "MyChart".  Sign up information is provided on this After Visit Summary.  MyChart is used to connect with patients for Virtual Visits (Telemedicine).  Patients are able to view lab/test results, encounter notes, upcoming appointments, etc.  Non-urgent messages can be sent to your provider as well.   To learn more about what you can do with MyChart, go to [email or web address].    Your next appointment:   6 month(s)  The format for your next appointment:   In Person  Provider:   Eleonore Chiquito, MD   Other Instructions None     Time Spent with Patient: I have spent a total of 25 minutes with patient reviewing hospital notes, telemetry, EKGs, labs and examining the patient as well as establishing an assessment and plan that was discussed with the patient.  > 50% of time was spent in direct patient care.  Signed, Addison Naegeli. Audie Box, [place]  [address] [place], [place] [address] [phone]  04/15/2020 5:10 PM

## 2020-04-15 ENCOUNTER — Other Ambulatory Visit: Payer: Self-pay

## 2020-04-15 ENCOUNTER — Ambulatory Visit: Payer: BC Managed Care – PPO | Admitting: Cardiovascular Disease

## 2020-04-15 ENCOUNTER — Encounter: Payer: Self-pay | Admitting: Cardiovascular Disease

## 2020-04-15 VITALS — BP 156/84 | HR 68 | Ht 66.0 in | Wt 178.0 lb

## 2020-04-15 DIAGNOSIS — I2510 Atherosclerotic heart disease of native coronary artery without angina pectoris: Secondary | ICD-10-CM

## 2020-04-15 DIAGNOSIS — E782 Mixed hyperlipidemia: Secondary | ICD-10-CM

## 2020-04-15 DIAGNOSIS — R895 Abnormal microbiological findings in specimens from other organs, systems and tissues: Secondary | ICD-10-CM | POA: Diagnosis not present

## 2020-04-15 DIAGNOSIS — N898 Other specified noninflammatory disorders of vagina: Secondary | ICD-10-CM | POA: Diagnosis not present

## 2020-04-15 DIAGNOSIS — I10 Essential (primary) hypertension: Secondary | ICD-10-CM | POA: Diagnosis not present

## 2020-04-15 NOTE — Patient Instructions (Signed)
Medication Instructions:  We will be referring you to our Pharmacist team for medication to manage for hyperlipidemia. We will be in touch with you with more information. *If you need a refill on your cardiac medications before your next appointment, please call your pharmacy*   Lab Work: None ordered If you have labs (blood work) drawn today and your tests are completely normal, you will receive your results only by: Marland Kitchen MyChart Message (if you have MyChart) OR . A paper copy in the mail If you have any lab test that is abnormal or we need to change your treatment, we will call you to review the results.   Testing/Procedures: None ordered   Follow-Up: At [facility], you and your health needs are our priority.  As part of our continuing mission to provide you with exceptional heart care, we have created designated Provider Care Teams.  These Care Teams include your primary Cardiologist (physician) and Advanced Practice Providers (APPs -  Physician Assistants and Nurse Practitioners) who all work together to provide you with the care you need, when you need it.  We recommend signing up for the patient portal called "MyChart".  Sign up information is provided on this After Visit Summary.  MyChart is used to connect with patients for Virtual Visits (Telemedicine).  Patients are able to view lab/test results, encounter notes, upcoming appointments, etc.  Non-urgent messages can be sent to your provider as well.   To learn more about what you can do with MyChart, go to [email or web address].    Your next appointment:   6 month(s)  The format for your next appointment:   In Person  Provider:   Eleonore Chiquito, MD   Other Instructions None

## 2020-04-23 ENCOUNTER — Ambulatory Visit
Admission: RE | Admit: 2020-04-23 | Discharge: 2020-04-23 | Disposition: A | Discharge status: Home / Self Care | Payer: 59 | Source: Ambulatory Visit | Attending: Obstetrics | Admitting: Obstetrics

## 2020-04-23 DIAGNOSIS — C50919 Malignant neoplasm of unspecified site of unspecified female breast: Secondary | ICD-10-CM

## 2020-04-23 DIAGNOSIS — N6489 Other specified disorders of breast: Secondary | ICD-10-CM | POA: Diagnosis not present

## 2020-04-23 DIAGNOSIS — Z803 Family history of malignant neoplasm of breast: Secondary | ICD-10-CM

## 2020-04-23 MED ORDER — GADOBUTROL 1 MMOL/ML IV SOLN
8.0000 mL | Freq: Once | INTRAVENOUS | Status: AC | PRN
  Administered 2020-04-23: 8 mL via INTRAVENOUS

## 2020-04-26 ENCOUNTER — Ambulatory Visit: Payer: BC Managed Care – PPO

## 2020-04-28 DIAGNOSIS — E782 Mixed hyperlipidemia: Secondary | ICD-10-CM | POA: Diagnosis not present

## 2020-04-28 DIAGNOSIS — I10 Essential (primary) hypertension: Secondary | ICD-10-CM | POA: Diagnosis not present

## 2020-04-28 DIAGNOSIS — I2510 Atherosclerotic heart disease of native coronary artery without angina pectoris: Secondary | ICD-10-CM | POA: Diagnosis not present

## 2020-05-03 DIAGNOSIS — I10 Essential (primary) hypertension: Secondary | ICD-10-CM | POA: Diagnosis not present

## 2020-05-03 DIAGNOSIS — S8992XA Unspecified injury of left lower leg, initial encounter: Secondary | ICD-10-CM | POA: Diagnosis not present

## 2020-05-31 DIAGNOSIS — I10 Essential (primary) hypertension: Secondary | ICD-10-CM | POA: Diagnosis not present

## 2020-05-31 DIAGNOSIS — E782 Mixed hyperlipidemia: Secondary | ICD-10-CM | POA: Diagnosis not present

## 2020-05-31 DIAGNOSIS — I2510 Atherosclerotic heart disease of native coronary artery without angina pectoris: Secondary | ICD-10-CM | POA: Diagnosis not present

## 2020-07-18 DIAGNOSIS — M79605 Pain in left leg: Secondary | ICD-10-CM | POA: Diagnosis not present

## 2020-07-18 DIAGNOSIS — M791 Myalgia, unspecified site: Secondary | ICD-10-CM | POA: Diagnosis not present

## 2020-07-27 ENCOUNTER — Encounter: Payer: Self-pay | Admitting: Cardiovascular Disease

## 2020-09-19 DIAGNOSIS — E1165 Type 2 diabetes mellitus with hyperglycemia: Secondary | ICD-10-CM | POA: Diagnosis not present

## 2020-09-19 DIAGNOSIS — E782 Mixed hyperlipidemia: Secondary | ICD-10-CM | POA: Diagnosis not present

## 2020-09-19 DIAGNOSIS — I10 Essential (primary) hypertension: Secondary | ICD-10-CM | POA: Diagnosis not present

## 2020-09-21 DIAGNOSIS — H2513 Age-related nuclear cataract, bilateral: Secondary | ICD-10-CM | POA: Diagnosis not present

## 2020-09-21 DIAGNOSIS — E119 Type 2 diabetes mellitus without complications: Secondary | ICD-10-CM | POA: Diagnosis not present

## 2020-09-21 DIAGNOSIS — H10413 Chronic giant papillary conjunctivitis, bilateral: Secondary | ICD-10-CM | POA: Diagnosis not present

## 2020-09-22 DIAGNOSIS — E1165 Type 2 diabetes mellitus with hyperglycemia: Secondary | ICD-10-CM | POA: Diagnosis not present

## 2020-09-22 DIAGNOSIS — I10 Essential (primary) hypertension: Secondary | ICD-10-CM | POA: Diagnosis not present

## 2020-09-22 DIAGNOSIS — J069 Acute upper respiratory infection, unspecified: Secondary | ICD-10-CM | POA: Diagnosis not present

## 2020-09-22 DIAGNOSIS — E782 Mixed hyperlipidemia: Secondary | ICD-10-CM | POA: Diagnosis not present

## 2020-10-12 ENCOUNTER — Ambulatory Visit: Payer: BC Managed Care – PPO | Admitting: Cardiovascular Disease

## 2020-10-14 ENCOUNTER — Ambulatory Visit: Payer: BC Managed Care – PPO | Admitting: Cardiovascular Disease

## 2020-11-18 ENCOUNTER — Telehealth: Payer: Self-pay | Admitting: Cardiovascular Disease

## 2020-11-18 NOTE — Telephone Encounter (Signed)
Contacted patient, she states that this morning her BP was elevated 174/99 and has continued to increased over the last few weeks. She has "not felt like herself". She denies SOB, or swelling in her hands/feet, unknown if any weight gain but states she feels her abdomen feels bigger. She does mention having chest tightness at times and with the feeling of not feeling well she wanted to be seen. She has continued her medication but her PCP stopped her Zetia due to her "triglycerides being good." Patient requested a sooner appointment with Dr.O'Neal. I did advise of an appointment on 07/25, but patient wanted something today. I advised that we did not have any same day appointments, nothing open on DOD schedules. Checked for Monday, but had nothing open. I advised with her if she felt it was an emergency and needed to be seen quickly that she go to the ED to be evaluated, but she states she will not go there as she does not have trust in them to treat her. I did advise all I had with Dr.O'Neal was on the 07/25, she did schedule for this day when he is back in office, but was advised again if any new or worsening symptoms to go to the ED. Will route to MD for any other recommendations.   Thanks!

## 2020-11-18 NOTE — Telephone Encounter (Signed)
Pt c/o BP issue: STAT if pt c/o blurred vision, one-sided weakness or slurred speech  1. What are your last 5 BP readings? 174/99  2. Are you having any other symptoms (ex. Dizziness, headache, blurred vision, passed out)? No   3. What is your BP issue? Pt is calling her BP is higher than normal this morning.She wants to be seen sooner that 8/5 advised this is the soonest appt

## 2020-11-18 NOTE — Telephone Encounter (Signed)
Noted. Appointment will stay for 07/25

## 2020-11-21 DIAGNOSIS — I10 Essential (primary) hypertension: Secondary | ICD-10-CM | POA: Diagnosis not present

## 2020-11-21 DIAGNOSIS — I2510 Atherosclerotic heart disease of native coronary artery without angina pectoris: Secondary | ICD-10-CM | POA: Diagnosis not present

## 2020-11-23 ENCOUNTER — Telehealth: Payer: Self-pay | Admitting: Cardiovascular Disease

## 2020-11-23 NOTE — Telephone Encounter (Signed)
Called patient, she is okay with the 07/25 appointment and will keep this appointment, advised that the 08/05 appointment was within a week or so, and no need to have those appointments so close together.  Patient verbalized understanding, will keep upcoming appointment.

## 2020-11-23 NOTE — Telephone Encounter (Signed)
Patient returning call.

## 2020-11-23 NOTE — Telephone Encounter (Signed)
Called, LVM to call back. Left call back number.

## 2020-11-23 NOTE — Telephone Encounter (Signed)
Patient called and wanted to cancel her appointment scheduled for 7/25 and get the appt from 8/05 back.   Please call patient

## 2020-11-27 NOTE — Progress Notes (Signed)
Cardiology Office Note:   Date:  11/28/2020  NAME:  Sarah Ali    MRN: [id number] DOB:  02/02/1956   PCP:  Merrilee Seashore, MD  Cardiologist:  Evalina Field, MD  Electrophysiologist:  None   Referring MD: Merrilee Seashore, MD   Chief Complaint  Patient presents with   Coronary Artery Disease    History of Present Illness:   Sarah Ali is a 65 y.o. female with a hx of Inferior STEMI s/p PCI 11/2019, DM, HLD who presents for follow-up. Has been having elevated BP and chest tightness. Seeing [facility] in addition to [place].   She reports 2 weeks ago she was having episodes of tightness in her chest.  Symptoms would occur randomly.  They were not exertional.  She noticed that her blood pressure had increased.  Values were between 037-543 systolically.  She called her office but was unable to get in.  She did go to [facility] cardiology.  They increased her Coreg to 25 mg twice daily.  She has remained on olmesartan 40 mg daily.  No change in diet.  She is not eating salty meals.  She is not exercising.  She did stop her job and then go [place].  She reports it was quite stressful.  She reports things are better from a mental standing.  She has remained on aspirin and Effient.  No bleeding issues.  She was started on Repatha by her primary care physician.  She reports that her Coreg was increased and her chest pain symptoms have resolved.  She is having no further tightness symptoms in her chest.  Her EKG in office demonstrates sinus rhythm heart rate 71 with no acute ischemic changes or evidence of infarction.  She overall appears to be doing much better.  We did discuss adding a medication as her blood pressure today is 148/88 versus waiting to determine if Coreg will improve her values.  I suspect with exercise in her current regimen her blood pressure will be better controlled.  She is opted for conservative approach.  Problem List 1. STEMI -11/2019 -DES to d RCA (occluded old  stent) -60% mid LAD -40% LCX -post infarct pericarditis 2. DM -A1c 5.9 3. HLD -LDL 85, HDL 42, TG 139, T chol 152  Past Medical History: Past Medical History:  Diagnosis Date   Asthma    childhood   BRCA1 positive    Bronchitis    Coronary artery disease    Diabetes mellitus without complication ([place])    Eczema    Family history of breast cancer    Family history of ovarian cancer    High cholesterol    Hypertension    Pneumonia     Past Surgical History: Past Surgical History:  Procedure Laterality Date   BREAST BIOPSY     BREAST SURGERY  07/05/2016   biopsy   COLONOSCOPY     CORONARY ANGIOPLASTY  2015   CORONARY/GRAFT ACUTE MI REVASCULARIZATION N/A 11/16/2019   Procedure: Coronary/Graft Acute MI Revascularization;  Surgeon: Nelva Bush, MD;  Location: [facility];  Service: Cardiovascular;  Laterality: N/A;   DILATATION & CURRETTAGE/HYSTEROSCOPY WITH RESECTOCOPE N/A 07/19/2016   Procedure: DILATATION & CURETTAGE/HYSTEROSCOPY WITH RESECTION OF ENDOMETRIAL POLYP WITH MYOSURE;  Surgeon: Servando Salina, MD;  Location: [facility];  Service: Gynecology;  Laterality: N/A;   LEFT HEART CATH AND CORONARY ANGIOGRAPHY N/A 11/16/2019   Procedure: LEFT HEART CATH AND CORONARY ANGIOGRAPHY;  Surgeon: Nelva Bush, MD;  Location: [facility];  Service: Cardiovascular;  Laterality: N/A;   ROBOTIC ASSISTED BILATERAL SALPINGO OOPHERECTOMY Bilateral 07/19/2016   Procedure: ROBOTIC ASSISTED BILATERAL SALPINGO OOPHORECTOMY AND LYSIS OF ADHESIONS, PELVIC WASHINGS;  Surgeon: Servando Salina, MD;  Location: [facility];  Service: Gynecology;  Laterality: Bilateral;  DO NOT DRAPE THE ROBOT     Current Medications: Current Meds  Medication Sig   aspirin EC 81 MG tablet Take 1 tablet (81 mg total) by mouth daily. Swallow whole.   atorvastatin (LIPITOR) 80 MG tablet Take 1 tablet (80 mg total) by mouth daily at 6 PM.   carvedilol (COREG) 25 MG tablet Take by mouth.    Cholecalciferol (VITAMIN D) 2000 units tablet Take 2,000 Units by mouth daily.   Evolocumab (REPATHA SURECLICK) 080 MG/ML SOAJ 140 mg   JANUVIA 100 MG tablet Take 100 mg by mouth daily.   metFORMIN (GLUCOPHAGE-XR) 500 MG 24 hr tablet Take 1,000 mg by mouth 2 (two) times daily.   Multiple Vitamins-Minerals (MULTIVITAMIN WITH MINERALS) tablet Take 1 tablet by mouth daily.   nitroGLYCERIN (NITROSTAT) 0.4 MG SL tablet Place 1 tablet (0.4 mg total) under the tongue every 5 (five) minutes as needed for chest pain.   olmesartan (BENICAR) 40 MG tablet Take 40 mg by mouth daily.   prasugrel (EFFIENT) 10 MG TABS tablet Take 1 tablet (10 mg total) by mouth daily.   vitamin E 400 UNIT capsule Take 400 Units by mouth daily.     Allergies:    Patient has no known allergies.   Social History: Social History   Socioeconomic History   Marital status: Widowed    Spouse name: Not on file   Number of children: 3   Years of education: Not on file   Highest education level: Not on file  Occupational History   Not on file  Tobacco Use   Smoking status: Never   Smokeless tobacco: Never  Substance and Sexual Activity   Alcohol use: Yes    Comment: occ   Drug use: No   Sexual activity: Not on file  Other Topics Concern   Not on file  Social History Narrative   Not on file   Social Determinants of Health   Financial Resource Strain: Not on file  Food Insecurity: Not on file  Transportation Needs: Not on file  Physical Activity: Not on file  Stress: Not on file  Social Connections: Not on file     Family History: The patient's family history includes BRCA 1/2 in her brother and maternal aunt; Breast cancer in her maternal aunt and maternal grandmother; Colon cancer in her paternal aunt; Colon cancer (age of onset: 60) in her mother; Ovarian cancer (age of onset: 35) in her mother; Parkinson's disease (age of onset: 49) in her maternal grandfather; Skin cancer in her father.  ROS:   All other  ROS reviewed and negative. Pertinent positives noted in the HPI.     EKGs/Labs/Other Studies Reviewed:   The following studies were personally reviewed by me today:  EKG:  EKG is ordered today.  The ekg ordered today demonstrates normal sinus rhythm heart rate 71, no acute ischemic changes or evidence of infarction, and was personally reviewed by me.   LHC 11/16/2019 Severe single-vessel coronary artery disease with thrombotic occlusion of the distal RCA involving the distal segment of old stent as well as unstented vessel beyond the stent.  There is also 80% stenosis involving the ostium of the RPDA. Moderate, noncritical disease involving the mid LAD, mid LCx, and  proximal RCA of up to 50-60%. Low normal left ventricular contraction with basal and mid inferior hypokinesis.  LVEF 50-55%. Mildly elevated left ventricular filling pressure. Successful PCI to distal RCA using a Synergy 2.5 x 24 mm drug-eluting stent extending from the distal half of the old stent to just before the bifurcation (postdilated to 2.9 mm) with 0% residual stenosis and TIMI-3 flow. Successful PTCA to the ostium of RCA using Emerge 2.0 x 12 mm balloon with reduction of stenosis from 80% to 20%.   Recommendations: Admit to cardiac ICU for post STEMI monitoring. Continue tirofiban infusion for 4 hours. Dual antiplatelet therapy with aspirin and prasugrel for at least 12 months.  Given overlapping stents in the distal RCA and very late stent thrombosis, consider long-term DAPT. Medical therapy of noncritical disease involving the LAD, LCx, and proximal RCA.  Functional study to assess hemodynamic significance of LAD disease should be considered when patient has recovered from acute MI. Aggressive secondary prevention.   Recent Labs: 01/08/2020: ALT 51   Recent Lipid Panel    Component Value Date/Time   CHOL 152 01/08/2020 0826   TRIG 139 01/08/2020 0826   HDL 42 01/08/2020 0826   CHOLHDL 3.6 01/08/2020 0826    CHOLHDL 4.2 11/16/2019 1102   VLDL 57 (H) 11/16/2019 1102   LDLCALC 85 01/08/2020 0826    Physical Exam:   VS:  BP (!) 148/82   Pulse 71   Ht 5' 6.5" (1.689 m)   Wt 180 lb 12.8 oz (82 kg)   SpO2 98%   BMI 28.74 kg/m    Wt Readings from Last 3 Encounters:  11/28/20 180 lb 12.8 oz (82 kg)  04/15/20 178 lb (80.7 kg)  03/04/20 175 lb 0.7 oz (79.4 kg)    General: Well nourished, well developed, in no acute distress Head: Atraumatic, normal size  Eyes: PEERLA, EOMI  Neck: Supple, no JVD Endocrine: No thryomegaly Cardiac: Normal S1, S2; RRR; no murmurs, rubs, or gallops Lungs: Clear to auscultation bilaterally, no wheezing, rhonchi or rales  Abd: Soft, nontender, no hepatomegaly  Ext: No edema, pulses 2+ Musculoskeletal: No deformities, BUE and BLE strength normal and equal Skin: Warm and dry, no rashes   Neuro: Alert and oriented to person, place, time, and situation, CNII-XII grossly intact, no focal deficits  Psych: Normal mood and affect   ASSESSMENT:   Sarah Ali is a 65 y.o. female who presents for the following: 1. Coronary artery disease involving native coronary artery of native heart without angina pectoris   2. Mixed hyperlipidemia   3. Essential hypertension     PLAN:   1. Coronary artery disease involving native coronary artery of native heart without angina pectoris 2. Mixed hyperlipidemia -STEMI in July 2021.  She had occlusion to old stent in the distal RCA.  She does have diffuse nonobstructive CAD in the LAD and mid left circumflex.  It was recommended to be considered for long-term antiplatelet agents.  She is having no bleeding issues.  I would favor at least 2 years of DAPT.  Continue aspirin 81 mg daily as well as Effient 10 mg daily. -Echocardiogram showed normal LV function. -She did have symptoms of chest tightness that occurred with elevated blood pressure.  EKG in office demonstrates normal sinus rhythm with no acute ischemic changes or evidence of  infarction.  Symptoms have improved with BP control.  For now we will continue what we are doing.  She has opted to try to exercise more as well  as to continue her current regimen before we change her blood pressure medications. -She is on Lipitor 80 mg daily.  She was started on Repatha by her primary care physician.  She will forward [place] the results as she reports her cholesterol is much improved.  Goal LDL cholesterol less than 70.  3. Essential hypertension -Blood pressure values have been increased.  No change in diet.  She is not exercising.  Stress levels are lower. -Cardiology last week increased her Coreg to 25 twice daily.  BP values are coming down.  BP values still not at goal.  She would like to continue with a conservative approach and continue her Coreg as well as Benicar.  She will exercise to see if she can get her values down this way.  If not I would recommend she add HCTZ or amlodipine.  Disposition: Return in about 3 months (around 02/28/2021).  Medication Adjustments/Labs and Tests Ordered: Current medicines are reviewed at length with the patient today.  Concerns regarding medicines are outlined above.  Orders Placed This Encounter  Procedures   EKG 12-Lead   No orders of the defined types were placed in this encounter.   Patient Instructions  Medication Instructions:  The current medical regimen is effective;  continue present plan and medications.   *If you need a refill on your cardiac medications before your next appointment, please call your pharmacy*  Follow-Up: At [facility], you and your health needs are our priority.  As part of our continuing mission to provide you with exceptional heart care, we have created designated Provider Care Teams.  These Care Teams include your primary Cardiologist (physician) and Advanced Practice Providers (APPs -  Physician Assistants and Nurse Practitioners) who all work together to provide you with the care you need, when  you need it.  We recommend signing up for the patient portal called "MyChart".  Sign up information is provided on this After Visit Summary.  MyChart is used to connect with patients for Virtual Visits (Telemedicine).  Patients are able to view lab/test results, encounter notes, upcoming appointments, etc.  Non-urgent messages can be sent to your provider as well.   To learn more about what you can do with MyChart, go to [email or web address].    Your next appointment:   3 month(s)  The format for your next appointment:   In Person  Provider:   Eleonore Chiquito, MD     Time Spent with Patient: I have spent a total of 35 minutes with patient reviewing hospital notes, telemetry, EKGs, labs and examining the patient as well as establishing an assessment and plan that was discussed with the patient.  > 50% of time was spent in direct patient care.  Signed, Addison Naegeli. Audie Box, MD, [facility]  [address], [place], [place] [address] [phone]  11/28/2020 10:55 AM

## 2020-11-28 ENCOUNTER — Other Ambulatory Visit: Payer: Self-pay

## 2020-11-28 ENCOUNTER — Encounter: Payer: Self-pay | Admitting: Cardiovascular Disease

## 2020-11-28 ENCOUNTER — Ambulatory Visit: Payer: BC Managed Care – PPO | Admitting: Cardiovascular Disease

## 2020-11-28 VITALS — BP 148/82 | HR 71 | Ht 66.5 in | Wt 180.8 lb

## 2020-11-28 DIAGNOSIS — I10 Essential (primary) hypertension: Secondary | ICD-10-CM

## 2020-11-28 DIAGNOSIS — E782 Mixed hyperlipidemia: Secondary | ICD-10-CM

## 2020-11-28 DIAGNOSIS — I2510 Atherosclerotic heart disease of native coronary artery without angina pectoris: Secondary | ICD-10-CM

## 2020-11-28 NOTE — Patient Instructions (Addendum)
Medication Instructions:  The current medical regimen is effective;  continue present plan and medications.  *If you need a refill on your cardiac medications before your next appointment, please call your pharmacy*   Follow-Up: At [facility], you and your health needs are our priority.  As part of our continuing mission to provide you with exceptional heart care, we have created designated Provider Care Teams.  These Care Teams include your primary Cardiologist (physician) and Advanced Practice Providers (APPs -  Physician Assistants and Nurse Practitioners) who all work together to provide you with the care you need, when you need it.  We recommend signing up for the patient portal called "MyChart".  Sign up information is provided on this After Visit Summary.  MyChart is used to connect with patients for Virtual Visits (Telemedicine).  Patients are able to view lab/test results, encounter notes, upcoming appointments, etc.  Non-urgent messages can be sent to your provider as well.   To learn more about what you can do with MyChart, go to [email or web address].    Your next appointment:   3 month(s)  The format for your next appointment:   In Person  Provider:   Doniphan O'Neal, MD     

## 2020-12-09 ENCOUNTER — Ambulatory Visit: Payer: BC Managed Care – PPO | Admitting: Cardiovascular Disease

## 2020-12-15 DIAGNOSIS — H1132 Conjunctival hemorrhage, left eye: Secondary | ICD-10-CM | POA: Diagnosis not present

## 2020-12-29 DIAGNOSIS — M5412 Radiculopathy, cervical region: Secondary | ICD-10-CM | POA: Diagnosis not present

## 2021-01-05 DIAGNOSIS — M542 Cervicalgia: Secondary | ICD-10-CM | POA: Diagnosis not present

## 2021-01-05 DIAGNOSIS — M5412 Radiculopathy, cervical region: Secondary | ICD-10-CM | POA: Diagnosis not present

## 2021-01-05 DIAGNOSIS — M6281 Muscle weakness (generalized): Secondary | ICD-10-CM | POA: Diagnosis not present

## 2021-01-12 DIAGNOSIS — M542 Cervicalgia: Secondary | ICD-10-CM | POA: Diagnosis not present

## 2021-01-12 DIAGNOSIS — I10 Essential (primary) hypertension: Secondary | ICD-10-CM | POA: Diagnosis not present

## 2021-01-12 DIAGNOSIS — E782 Mixed hyperlipidemia: Secondary | ICD-10-CM | POA: Diagnosis not present

## 2021-01-12 DIAGNOSIS — M5412 Radiculopathy, cervical region: Secondary | ICD-10-CM | POA: Diagnosis not present

## 2021-01-12 DIAGNOSIS — E1165 Type 2 diabetes mellitus with hyperglycemia: Secondary | ICD-10-CM | POA: Diagnosis not present

## 2021-01-12 DIAGNOSIS — M6281 Muscle weakness (generalized): Secondary | ICD-10-CM | POA: Diagnosis not present

## 2021-01-16 DIAGNOSIS — M5412 Radiculopathy, cervical region: Secondary | ICD-10-CM | POA: Diagnosis not present

## 2021-01-16 DIAGNOSIS — M6281 Muscle weakness (generalized): Secondary | ICD-10-CM | POA: Diagnosis not present

## 2021-01-16 DIAGNOSIS — M542 Cervicalgia: Secondary | ICD-10-CM | POA: Diagnosis not present

## 2021-01-18 DIAGNOSIS — M5412 Radiculopathy, cervical region: Secondary | ICD-10-CM | POA: Diagnosis not present

## 2021-01-18 DIAGNOSIS — M6281 Muscle weakness (generalized): Secondary | ICD-10-CM | POA: Diagnosis not present

## 2021-01-18 DIAGNOSIS — M542 Cervicalgia: Secondary | ICD-10-CM | POA: Diagnosis not present

## 2021-01-19 DIAGNOSIS — E782 Mixed hyperlipidemia: Secondary | ICD-10-CM | POA: Diagnosis not present

## 2021-01-19 DIAGNOSIS — M5412 Radiculopathy, cervical region: Secondary | ICD-10-CM | POA: Diagnosis not present

## 2021-01-19 DIAGNOSIS — E1165 Type 2 diabetes mellitus with hyperglycemia: Secondary | ICD-10-CM | POA: Diagnosis not present

## 2021-01-19 DIAGNOSIS — I10 Essential (primary) hypertension: Secondary | ICD-10-CM | POA: Diagnosis not present

## 2021-01-20 ENCOUNTER — Other Ambulatory Visit: Payer: Self-pay | Admitting: Internal Medicine

## 2021-01-20 DIAGNOSIS — M5412 Radiculopathy, cervical region: Secondary | ICD-10-CM

## 2021-01-23 DIAGNOSIS — M6281 Muscle weakness (generalized): Secondary | ICD-10-CM | POA: Diagnosis not present

## 2021-01-23 DIAGNOSIS — M5412 Radiculopathy, cervical region: Secondary | ICD-10-CM | POA: Diagnosis not present

## 2021-01-23 DIAGNOSIS — M542 Cervicalgia: Secondary | ICD-10-CM | POA: Diagnosis not present

## 2021-01-25 DIAGNOSIS — M6281 Muscle weakness (generalized): Secondary | ICD-10-CM | POA: Diagnosis not present

## 2021-01-25 DIAGNOSIS — M5412 Radiculopathy, cervical region: Secondary | ICD-10-CM | POA: Diagnosis not present

## 2021-01-25 DIAGNOSIS — M542 Cervicalgia: Secondary | ICD-10-CM | POA: Diagnosis not present

## 2021-01-30 DIAGNOSIS — Z6829 Body mass index (BMI) 29.0-29.9, adult: Secondary | ICD-10-CM | POA: Diagnosis not present

## 2021-01-30 DIAGNOSIS — Z1501 Genetic susceptibility to malignant neoplasm of breast: Secondary | ICD-10-CM | POA: Diagnosis not present

## 2021-01-30 DIAGNOSIS — Z1231 Encounter for screening mammogram for malignant neoplasm of breast: Secondary | ICD-10-CM | POA: Diagnosis not present

## 2021-01-30 DIAGNOSIS — Z01419 Encounter for gynecological examination (general) (routine) without abnormal findings: Secondary | ICD-10-CM | POA: Diagnosis not present

## 2021-01-31 DIAGNOSIS — M542 Cervicalgia: Secondary | ICD-10-CM | POA: Diagnosis not present

## 2021-01-31 DIAGNOSIS — M6281 Muscle weakness (generalized): Secondary | ICD-10-CM | POA: Diagnosis not present

## 2021-01-31 DIAGNOSIS — M5412 Radiculopathy, cervical region: Secondary | ICD-10-CM | POA: Diagnosis not present

## 2021-02-02 DIAGNOSIS — M6281 Muscle weakness (generalized): Secondary | ICD-10-CM | POA: Diagnosis not present

## 2021-02-02 DIAGNOSIS — M542 Cervicalgia: Secondary | ICD-10-CM | POA: Diagnosis not present

## 2021-02-02 DIAGNOSIS — M5412 Radiculopathy, cervical region: Secondary | ICD-10-CM | POA: Diagnosis not present

## 2021-02-06 DIAGNOSIS — M542 Cervicalgia: Secondary | ICD-10-CM | POA: Diagnosis not present

## 2021-02-06 DIAGNOSIS — M6281 Muscle weakness (generalized): Secondary | ICD-10-CM | POA: Diagnosis not present

## 2021-02-06 DIAGNOSIS — M5412 Radiculopathy, cervical region: Secondary | ICD-10-CM | POA: Diagnosis not present

## 2021-02-09 DIAGNOSIS — M5412 Radiculopathy, cervical region: Secondary | ICD-10-CM | POA: Diagnosis not present

## 2021-02-09 DIAGNOSIS — M6281 Muscle weakness (generalized): Secondary | ICD-10-CM | POA: Diagnosis not present

## 2021-02-09 DIAGNOSIS — M542 Cervicalgia: Secondary | ICD-10-CM | POA: Diagnosis not present

## 2021-02-12 ENCOUNTER — Other Ambulatory Visit: Payer: Self-pay

## 2021-02-12 ENCOUNTER — Ambulatory Visit
Admission: RE | Admit: 2021-02-12 | Discharge: 2021-02-12 | Disposition: A | Discharge status: Home / Self Care | Payer: BC Managed Care – PPO | Source: Ambulatory Visit | Attending: Internal Medicine | Admitting: Internal Medicine

## 2021-02-12 DIAGNOSIS — M542 Cervicalgia: Secondary | ICD-10-CM | POA: Diagnosis not present

## 2021-02-12 DIAGNOSIS — M4802 Spinal stenosis, cervical region: Secondary | ICD-10-CM | POA: Diagnosis not present

## 2021-02-12 DIAGNOSIS — M5412 Radiculopathy, cervical region: Secondary | ICD-10-CM

## 2021-02-13 DIAGNOSIS — M6281 Muscle weakness (generalized): Secondary | ICD-10-CM | POA: Diagnosis not present

## 2021-02-13 DIAGNOSIS — M542 Cervicalgia: Secondary | ICD-10-CM | POA: Diagnosis not present

## 2021-02-13 DIAGNOSIS — M5412 Radiculopathy, cervical region: Secondary | ICD-10-CM | POA: Diagnosis not present

## 2021-02-15 DIAGNOSIS — M542 Cervicalgia: Secondary | ICD-10-CM | POA: Diagnosis not present

## 2021-02-15 DIAGNOSIS — M6281 Muscle weakness (generalized): Secondary | ICD-10-CM | POA: Diagnosis not present

## 2021-02-15 DIAGNOSIS — M5412 Radiculopathy, cervical region: Secondary | ICD-10-CM | POA: Diagnosis not present

## 2021-03-01 DIAGNOSIS — M5412 Radiculopathy, cervical region: Secondary | ICD-10-CM | POA: Diagnosis not present

## 2021-03-01 DIAGNOSIS — M6281 Muscle weakness (generalized): Secondary | ICD-10-CM | POA: Diagnosis not present

## 2021-03-01 DIAGNOSIS — M542 Cervicalgia: Secondary | ICD-10-CM | POA: Diagnosis not present

## 2021-03-03 DIAGNOSIS — M6281 Muscle weakness (generalized): Secondary | ICD-10-CM | POA: Diagnosis not present

## 2021-03-03 DIAGNOSIS — M542 Cervicalgia: Secondary | ICD-10-CM | POA: Diagnosis not present

## 2021-03-03 DIAGNOSIS — M5412 Radiculopathy, cervical region: Secondary | ICD-10-CM | POA: Diagnosis not present

## 2021-03-06 DIAGNOSIS — M6281 Muscle weakness (generalized): Secondary | ICD-10-CM | POA: Diagnosis not present

## 2021-03-06 DIAGNOSIS — M5412 Radiculopathy, cervical region: Secondary | ICD-10-CM | POA: Diagnosis not present

## 2021-03-06 DIAGNOSIS — M542 Cervicalgia: Secondary | ICD-10-CM | POA: Diagnosis not present

## 2021-03-10 DIAGNOSIS — M5412 Radiculopathy, cervical region: Secondary | ICD-10-CM | POA: Diagnosis not present

## 2021-03-10 DIAGNOSIS — M6281 Muscle weakness (generalized): Secondary | ICD-10-CM | POA: Diagnosis not present

## 2021-03-10 DIAGNOSIS — M542 Cervicalgia: Secondary | ICD-10-CM | POA: Diagnosis not present

## 2021-03-16 DIAGNOSIS — M6281 Muscle weakness (generalized): Secondary | ICD-10-CM | POA: Diagnosis not present

## 2021-03-16 DIAGNOSIS — M5412 Radiculopathy, cervical region: Secondary | ICD-10-CM | POA: Diagnosis not present

## 2021-03-16 DIAGNOSIS — M542 Cervicalgia: Secondary | ICD-10-CM | POA: Diagnosis not present

## 2021-03-19 NOTE — Progress Notes (Signed)
Cardiology Office Note:   Date:  03/20/2021  NAME:  Sarah Ali    MRN: [id number] DOB:  Aug 03, 1955   PCP:  Merrilee Seashore, MD  Cardiologist:  Evalina Field, MD  Electrophysiologist:  None   Referring MD: Merrilee Seashore, MD   Chief Complaint  Patient presents with   Follow-up         History of Present Illness:   Sarah Ali is a 65 y.o. female with a hx of CAD, DM, HTN, HLD who presents for follow-up. Seen in July for elevated BP. Coreg was increased to 25 mg BID. Also on benicar. Started on repatha by PCP.  She reports that her cholesterol level has improved.  She will forward me the results.  She has follow-up labs in the next few weeks. Blood pressure is much improved.  She apparently has been diagnosed with cervical stenoses.  She has severe narrowing in the cervical spine.  This appears to be bilateral.  Symptoms appear to be sharp shooting pain in the left arm.  She has no pain in the right arm.  She denies any significant chest pain or shortness of breath.  She is working on her diabetes and this number has improved.  She overall seems to be doing quite well.  From a cardiovascular standpoint she appears to be stable.  Blood pressure is improved.  Not back at work quite yet.  She did take a break from her job about [place].  She is contemplating her next move.  Problem List 1. STEMI -11/2019 -DES to d RCA (occluded old stent) -60% mid LAD -40% LCX -post infarct pericarditis 2. DM -A1c 5.9 3. HLD -LDL 85, HDL 42, TG 139, T chol 152 4. HTN  Past Medical History: Past Medical History:  Diagnosis Date   Asthma    childhood   BRCA1 positive    Bronchitis    Coronary artery disease    Diabetes mellitus without complication ([place])    Eczema    Family history of breast cancer    Family history of ovarian cancer    High cholesterol    Hypertension    Pneumonia     Past Surgical History: Past Surgical History:  Procedure Laterality Date    BREAST BIOPSY     BREAST SURGERY  07/05/2016   biopsy   COLONOSCOPY     CORONARY ANGIOPLASTY  2015   CORONARY/GRAFT ACUTE MI REVASCULARIZATION N/A 11/16/2019   Procedure: Coronary/Graft Acute MI Revascularization;  Surgeon: Nelva Bush, MD;  Location: [facility];  Service: Cardiovascular;  Laterality: N/A;   DILATATION & CURRETTAGE/HYSTEROSCOPY WITH RESECTOCOPE N/A 07/19/2016   Procedure: DILATATION & CURETTAGE/HYSTEROSCOPY WITH RESECTION OF ENDOMETRIAL POLYP WITH MYOSURE;  Surgeon: Servando Salina, MD;  Location: [facility];  Service: Gynecology;  Laterality: N/A;   LEFT HEART CATH AND CORONARY ANGIOGRAPHY N/A 11/16/2019   Procedure: LEFT HEART CATH AND CORONARY ANGIOGRAPHY;  Surgeon: Nelva Bush, MD;  Location: [facility];  Service: Cardiovascular;  Laterality: N/A;   ROBOTIC ASSISTED BILATERAL SALPINGO OOPHERECTOMY Bilateral 07/19/2016   Procedure: ROBOTIC ASSISTED BILATERAL SALPINGO OOPHORECTOMY AND LYSIS OF ADHESIONS, PELVIC WASHINGS;  Surgeon: Servando Salina, MD;  Location: [facility];  Service: Gynecology;  Laterality: Bilateral;  DO NOT DRAPE THE ROBOT     Current Medications: Current Meds  Medication Sig   aspirin EC 81 MG tablet Take 1 tablet (81 mg total) by mouth daily. Swallow whole.   atorvastatin (LIPITOR) 80 MG tablet Take 1 tablet (80  mg total) by mouth daily at 6 PM.   carvedilol (COREG) 25 MG tablet Take by mouth.   Cholecalciferol (VITAMIN D) 2000 units tablet Take 2,000 Units by mouth daily.   Evolocumab (REPATHA SURECLICK) 322 MG/ML SOAJ 140 mg   JANUVIA 100 MG tablet Take 100 mg by mouth daily.   metFORMIN (GLUCOPHAGE-XR) 500 MG 24 hr tablet Take 1,000 mg by mouth 2 (two) times daily.   Multiple Vitamins-Minerals (MULTIVITAMIN WITH MINERALS) tablet Take 1 tablet by mouth daily.   nitroGLYCERIN (NITROSTAT) 0.4 MG SL tablet Place 1 tablet (0.4 mg total) under the tongue every 5 (five) minutes as needed for chest pain.   olmesartan (BENICAR) 40 MG  tablet Take 40 mg by mouth daily.   prasugrel (EFFIENT) 10 MG TABS tablet Take 1 tablet (10 mg total) by mouth daily.   tiZANidine (ZANAFLEX) 4 MG tablet Take 4 mg by mouth 3 (three) times daily as needed.   vitamin E 400 UNIT capsule Take 400 Units by mouth daily.     Allergies:    Patient has no known allergies.   Social History: Social History   Socioeconomic History   Marital status: Widowed    Spouse name: Not on file   Number of children: 3   Years of education: Not on file   Highest education level: Not on file  Occupational History   Not on file  Tobacco Use   Smoking status: Never   Smokeless tobacco: Never  Substance and Sexual Activity   Alcohol use: Yes    Comment: occ   Drug use: No   Sexual activity: Not on file  Other Topics Concern   Not on file  Social History Narrative   Not on file   Social Determinants of Health   Financial Resource Strain: Not on file  Food Insecurity: Not on file  Transportation Needs: Not on file  Physical Activity: Not on file  Stress: Not on file  Social Connections: Not on file     Family History: The patient's family history includes BRCA 1/2 in her brother and maternal aunt; Breast cancer in her maternal aunt and maternal grandmother; Colon cancer in her paternal aunt; Colon cancer (age of onset: 82) in her mother; Ovarian cancer (age of onset: 50) in her mother; Parkinson's disease (age of onset: 49) in her maternal grandfather; Skin cancer in her father.  ROS:   All other ROS reviewed and negative. Pertinent positives noted in the HPI.     EKGs/Labs/Other Studies Reviewed:   The following studies were personally reviewed by me today:  TTE 11/2019  1. Normal LV function; grade 1 diastolic dysfunction; mild LVH; small  pericardial effusion.   2. Left ventricular ejection fraction, by estimation, is 55 to 60%. The  left ventricle has normal function. The left ventricle has no regional  wall motion abnormalities. There  is mild left ventricular hypertrophy.  Left ventricular diastolic parameters  are consistent with Grade I diastolic dysfunction (impaired relaxation).   3. Right ventricular systolic function is normal. The right ventricular  size is normal.   4. The mitral valve is normal in structure. No evidence of mitral valve  regurgitation. No evidence of mitral stenosis.   5. The aortic valve is tricuspid. Aortic valve regurgitation is not  visualized. No aortic stenosis is present.   6. The inferior vena cava is normal in size with greater than 50%  respiratory variability, suggesting right atrial pressure of 3 mmHg.   LHC 11/2019 Conclusions:  Severe single-vessel coronary artery disease with thrombotic occlusion of the distal RCA involving the distal segment of old stent as well as unstented vessel beyond the stent.  There is also 80% stenosis involving the ostium of the RPDA. Moderate, noncritical disease involving the mid LAD, mid LCx, and proximal RCA of up to 50-60%. Low normal left ventricular contraction with basal and mid inferior hypokinesis.  LVEF 50-55%. Mildly elevated left ventricular filling pressure. Successful PCI to distal RCA using a Synergy 2.5 x 24 mm drug-eluting stent extending from the distal half of the old stent to just before the bifurcation (postdilated to 2.9 mm) with 0% residual stenosis and TIMI-3 flow. Successful PTCA to the ostium of RCA using Emerge 2.0 x 12 mm balloon with reduction of stenosis from 80% to 20%.   Recent Labs: No results found for requested labs within last 8760 hours.   Recent Lipid Panel    Component Value Date/Time   CHOL 152 01/08/2020 0826   TRIG 139 01/08/2020 0826   HDL 42 01/08/2020 0826   CHOLHDL 3.6 01/08/2020 0826   CHOLHDL 4.2 11/16/2019 1102   VLDL 57 (H) 11/16/2019 1102   LDLCALC 85 01/08/2020 0826    Physical Exam:   VS:  BP 130/86 (BP Location: Right Arm, Patient Position: Sitting, Cuff Size: Normal)   Pulse 76   Resp 20    Ht '5\' 6"'  (1.676 m)   Wt 183 lb 6.4 oz (83.2 kg)   SpO2 99%   BMI 29.60 kg/m    Wt Readings from Last 3 Encounters:  03/20/21 183 lb 6.4 oz (83.2 kg)  11/28/20 180 lb 12.8 oz (82 kg)  04/15/20 178 lb (80.7 kg)    General: Well nourished, well developed, in no acute distress Head: Atraumatic, normal size  Eyes: PEERLA, EOMI  Neck: Supple, no JVD Endocrine: No thryomegaly Cardiac: Normal S1, S2; RRR; no murmurs, rubs, or gallops Lungs: Clear to auscultation bilaterally, no wheezing, rhonchi or rales  Abd: Soft, nontender, no hepatomegaly  Ext: No edema, pulses 2+ Musculoskeletal: No deformities, BUE and BLE strength normal and equal Skin: Warm and dry, no rashes   Neuro: Alert and oriented to person, place, time, and situation, CNII-XII grossly intact, no focal deficits  Psych: Normal mood and affect   ASSESSMENT:   Angelise Petrich is a 65 y.o. female who presents for the following: 1. Coronary artery disease involving native coronary artery of native heart without angina pectoris   2. Mixed hyperlipidemia   3. Essential hypertension     PLAN:   1. Coronary artery disease involving native coronary artery of native heart without angina pectoris 2. Mixed hyperlipidemia -She suffered an ST elevation myocardial infarction in July 2021.  She underwent drug-eluting stent to an old distal RCA stent.  She had moderate disease in the mid LAD and circumflex.  She completed 1 year of DAPT.  Seems to be doing well.  She will continue aspirin 81 mg daily.  She is on Repatha, Lipitor and Zetia.  Zetia was stopped by her primary care physician.  She has plans to repeat her lab work in the next few weeks.  She will forward me the results.  Goal LDL cholesterol less than 50.  I suspect she will get the wrong Repatha.  She is on Coreg.  No significant symptoms of angina.  Echocardiogram showed normal LV function last year.  3. Essential hypertension -Blood pressure is much better controlled today.  I  suspect some of her elevation  of blood pressure could be due to pain.  She has severe bilateral cervical stenoses in the spine.  She will see a neurosurgeon.  She will continue Coreg and Benicar for now.     Disposition: Return in about 6 months (around 09/17/2021).  Medication Adjustments/Labs and Tests Ordered: Current medicines are reviewed at length with the patient today.  Concerns regarding medicines are outlined above.  No orders of the defined types were placed in this encounter.  No orders of the defined types were placed in this encounter.   Patient Instructions  Medication Instructions:  The current medical regimen is effective;  continue present plan and medications.  *If you need a refill on your cardiac medications before your next appointment, please call your pharmacy*   Follow-Up: At [facility], you and your health needs are our priority.  As part of our continuing mission to provide you with exceptional heart care, we have created designated Provider Care Teams.  These Care Teams include your primary Cardiologist (physician) and Advanced Practice Providers (APPs -  Physician Assistants and Nurse Practitioners) who all work together to provide you with the care you need, when you need it.  We recommend signing up for the patient portal called "MyChart".  Sign up information is provided on this After Visit Summary.  MyChart is used to connect with patients for Virtual Visits (Telemedicine).  Patients are able to view lab/test results, encounter notes, upcoming appointments, etc.  Non-urgent messages can be sent to your provider as well.   To learn more about what you can do with MyChart, go to [email or web address].    Your next appointment:   6 month(s)  The format for your next appointment:   In Person  Provider:   Evalina Field, MD      Time Spent with Patient: I have spent a total of 25 minutes with patient reviewing hospital notes, telemetry, EKGs, labs  and examining the patient as well as establishing an assessment and plan that was discussed with the patient.  > 50% of time was spent in direct patient care.  Signed, Addison Naegeli. Audie Box, MD, [facility]  [address] [place], [place] [address] [phone]  03/20/2021 4:45 PM

## 2021-03-20 ENCOUNTER — Ambulatory Visit: Payer: BC Managed Care – PPO | Admitting: Cardiovascular Disease

## 2021-03-20 ENCOUNTER — Other Ambulatory Visit: Payer: Self-pay

## 2021-03-20 ENCOUNTER — Encounter: Payer: Self-pay | Admitting: Cardiovascular Disease

## 2021-03-20 VITALS — BP 130/86 | HR 76 | Resp 20 | Ht 66.0 in | Wt 183.4 lb

## 2021-03-20 DIAGNOSIS — I10 Essential (primary) hypertension: Secondary | ICD-10-CM | POA: Diagnosis not present

## 2021-03-20 DIAGNOSIS — I2510 Atherosclerotic heart disease of native coronary artery without angina pectoris: Secondary | ICD-10-CM

## 2021-03-20 DIAGNOSIS — E782 Mixed hyperlipidemia: Secondary | ICD-10-CM

## 2021-03-20 NOTE — Patient Instructions (Signed)
Medication Instructions:  ?The current medical regimen is effective;  continue present plan and medications. ? ?*If you need a refill on your cardiac medications before your next appointment, please call your pharmacy* ? ? ?Follow-Up: ?At [facility], you and your health needs are our priority.  As part of our continuing mission to provide you with exceptional heart care, we have created designated Provider Care Teams.  These Care Teams include your primary Cardiologist (physician) and Advanced Practice Providers (APPs -  Physician Assistants and Nurse Practitioners) who all work together to provide you with the care you need, when you need it. ? ?We recommend signing up for the patient portal called "MyChart".  Sign up information is provided on this After Visit Summary.  MyChart is used to connect with patients for Virtual Visits (Telemedicine).  Patients are able to view lab/test results, encounter notes, upcoming appointments, etc.  Non-urgent messages can be sent to your provider as well.   ?To learn more about what you can do with MyChart, go to [email or web address].   ? ?Your next appointment:   ?6 month(s) ? ?The format for your next appointment:   ?In Person ? ?Provider:   ?Elmendorf T O'Neal, MD { ? ? ? ? ? ? ? ?

## 2021-03-22 DIAGNOSIS — M542 Cervicalgia: Secondary | ICD-10-CM | POA: Diagnosis not present

## 2021-03-22 DIAGNOSIS — M5412 Radiculopathy, cervical region: Secondary | ICD-10-CM | POA: Diagnosis not present

## 2021-03-22 DIAGNOSIS — M6281 Muscle weakness (generalized): Secondary | ICD-10-CM | POA: Diagnosis not present

## 2021-03-28 DIAGNOSIS — M5412 Radiculopathy, cervical region: Secondary | ICD-10-CM | POA: Diagnosis not present

## 2021-03-28 DIAGNOSIS — M542 Cervicalgia: Secondary | ICD-10-CM | POA: Diagnosis not present

## 2021-03-28 DIAGNOSIS — M6281 Muscle weakness (generalized): Secondary | ICD-10-CM | POA: Diagnosis not present

## 2021-04-03 DIAGNOSIS — M47819 Spondylosis without myelopathy or radiculopathy, site unspecified: Secondary | ICD-10-CM | POA: Diagnosis not present

## 2021-04-03 DIAGNOSIS — M542 Cervicalgia: Secondary | ICD-10-CM | POA: Diagnosis not present

## 2021-04-03 DIAGNOSIS — M6281 Muscle weakness (generalized): Secondary | ICD-10-CM | POA: Diagnosis not present

## 2021-04-03 DIAGNOSIS — M5412 Radiculopathy, cervical region: Secondary | ICD-10-CM | POA: Diagnosis not present

## 2021-04-03 DIAGNOSIS — M5020 Other cervical disc displacement, unspecified cervical region: Secondary | ICD-10-CM | POA: Diagnosis not present

## 2021-04-11 DIAGNOSIS — M5412 Radiculopathy, cervical region: Secondary | ICD-10-CM | POA: Diagnosis not present

## 2021-04-11 DIAGNOSIS — M6281 Muscle weakness (generalized): Secondary | ICD-10-CM | POA: Diagnosis not present

## 2021-04-11 DIAGNOSIS — M542 Cervicalgia: Secondary | ICD-10-CM | POA: Diagnosis not present

## 2021-04-13 DIAGNOSIS — M542 Cervicalgia: Secondary | ICD-10-CM | POA: Diagnosis not present

## 2021-04-13 DIAGNOSIS — M6281 Muscle weakness (generalized): Secondary | ICD-10-CM | POA: Diagnosis not present

## 2021-04-13 DIAGNOSIS — M5412 Radiculopathy, cervical region: Secondary | ICD-10-CM | POA: Diagnosis not present

## 2021-04-17 DIAGNOSIS — Z0000 Encounter for general adult medical examination without abnormal findings: Secondary | ICD-10-CM | POA: Diagnosis not present

## 2021-04-17 DIAGNOSIS — M5412 Radiculopathy, cervical region: Secondary | ICD-10-CM | POA: Diagnosis not present

## 2021-04-17 DIAGNOSIS — E782 Mixed hyperlipidemia: Secondary | ICD-10-CM | POA: Diagnosis not present

## 2021-04-17 DIAGNOSIS — Z955 Presence of coronary angioplasty implant and graft: Secondary | ICD-10-CM | POA: Diagnosis not present

## 2021-04-17 DIAGNOSIS — E1165 Type 2 diabetes mellitus with hyperglycemia: Secondary | ICD-10-CM | POA: Diagnosis not present

## 2021-04-17 DIAGNOSIS — I10 Essential (primary) hypertension: Secondary | ICD-10-CM | POA: Diagnosis not present

## 2021-04-17 DIAGNOSIS — M6281 Muscle weakness (generalized): Secondary | ICD-10-CM | POA: Diagnosis not present

## 2021-04-17 DIAGNOSIS — M542 Cervicalgia: Secondary | ICD-10-CM | POA: Diagnosis not present

## 2021-04-17 DIAGNOSIS — I2510 Atherosclerotic heart disease of native coronary artery without angina pectoris: Secondary | ICD-10-CM | POA: Diagnosis not present

## 2021-04-20 DIAGNOSIS — M542 Cervicalgia: Secondary | ICD-10-CM | POA: Diagnosis not present

## 2021-04-20 DIAGNOSIS — M5412 Radiculopathy, cervical region: Secondary | ICD-10-CM | POA: Diagnosis not present

## 2021-04-20 DIAGNOSIS — E1122 Type 2 diabetes mellitus with diabetic chronic kidney disease: Secondary | ICD-10-CM | POA: Diagnosis not present

## 2021-04-20 DIAGNOSIS — M6281 Muscle weakness (generalized): Secondary | ICD-10-CM | POA: Diagnosis not present

## 2021-04-20 DIAGNOSIS — I2510 Atherosclerotic heart disease of native coronary artery without angina pectoris: Secondary | ICD-10-CM | POA: Diagnosis not present

## 2021-04-20 DIAGNOSIS — Z0000 Encounter for general adult medical examination without abnormal findings: Secondary | ICD-10-CM | POA: Diagnosis not present

## 2021-04-20 DIAGNOSIS — Z23 Encounter for immunization: Secondary | ICD-10-CM | POA: Diagnosis not present

## 2021-04-20 DIAGNOSIS — N182 Chronic kidney disease, stage 2 (mild): Secondary | ICD-10-CM | POA: Diagnosis not present

## 2021-04-24 DIAGNOSIS — M5412 Radiculopathy, cervical region: Secondary | ICD-10-CM | POA: Diagnosis not present

## 2021-04-24 DIAGNOSIS — M6281 Muscle weakness (generalized): Secondary | ICD-10-CM | POA: Diagnosis not present

## 2021-04-24 DIAGNOSIS — M542 Cervicalgia: Secondary | ICD-10-CM | POA: Diagnosis not present

## 2021-04-27 DIAGNOSIS — M5412 Radiculopathy, cervical region: Secondary | ICD-10-CM | POA: Diagnosis not present

## 2021-04-27 DIAGNOSIS — M6281 Muscle weakness (generalized): Secondary | ICD-10-CM | POA: Diagnosis not present

## 2021-04-27 DIAGNOSIS — M542 Cervicalgia: Secondary | ICD-10-CM | POA: Diagnosis not present

## 2021-05-02 DIAGNOSIS — M542 Cervicalgia: Secondary | ICD-10-CM | POA: Diagnosis not present

## 2021-05-02 DIAGNOSIS — M5412 Radiculopathy, cervical region: Secondary | ICD-10-CM | POA: Diagnosis not present

## 2021-05-02 DIAGNOSIS — M6281 Muscle weakness (generalized): Secondary | ICD-10-CM | POA: Diagnosis not present

## 2021-05-04 DIAGNOSIS — M6281 Muscle weakness (generalized): Secondary | ICD-10-CM | POA: Diagnosis not present

## 2021-05-04 DIAGNOSIS — M5412 Radiculopathy, cervical region: Secondary | ICD-10-CM | POA: Diagnosis not present

## 2021-05-04 DIAGNOSIS — M542 Cervicalgia: Secondary | ICD-10-CM | POA: Diagnosis not present

## 2021-06-05 ENCOUNTER — Other Ambulatory Visit: Payer: Self-pay | Admitting: Obstetrics

## 2021-06-05 DIAGNOSIS — Z1509 Genetic susceptibility to other malignant neoplasm: Secondary | ICD-10-CM

## 2021-07-07 ENCOUNTER — Other Ambulatory Visit: Payer: Self-pay

## 2021-07-07 ENCOUNTER — Ambulatory Visit
Admission: RE | Admit: 2021-07-07 | Discharge: 2021-07-07 | Disposition: A | Discharge status: Home / Self Care | Payer: BC Managed Care – PPO | Source: Ambulatory Visit | Attending: Obstetrics | Admitting: Obstetrics

## 2021-07-07 DIAGNOSIS — Z1509 Genetic susceptibility to other malignant neoplasm: Secondary | ICD-10-CM

## 2021-07-12 ENCOUNTER — Other Ambulatory Visit: Payer: Self-pay

## 2021-07-12 ENCOUNTER — Ambulatory Visit
Admission: RE | Admit: 2021-07-12 | Discharge: 2021-07-12 | Disposition: A | Discharge status: Home / Self Care | Payer: BC Managed Care – PPO | Source: Ambulatory Visit | Attending: Obstetrics | Admitting: Obstetrics

## 2021-07-12 DIAGNOSIS — Z803 Family history of malignant neoplasm of breast: Secondary | ICD-10-CM | POA: Diagnosis not present

## 2021-07-12 DIAGNOSIS — Z1239 Encounter for other screening for malignant neoplasm of breast: Secondary | ICD-10-CM | POA: Diagnosis not present

## 2021-07-12 MED ORDER — GADOBUTROL 1 MMOL/ML IV SOLN
9.0000 mL | Freq: Once | INTRAVENOUS | Status: AC | PRN
  Administered 2021-07-12: 9 mL via INTRAVENOUS

## 2021-08-31 DIAGNOSIS — N182 Chronic kidney disease, stage 2 (mild): Secondary | ICD-10-CM | POA: Diagnosis not present

## 2021-08-31 DIAGNOSIS — E1122 Type 2 diabetes mellitus with diabetic chronic kidney disease: Secondary | ICD-10-CM | POA: Diagnosis not present

## 2021-08-31 DIAGNOSIS — I2510 Atherosclerotic heart disease of native coronary artery without angina pectoris: Secondary | ICD-10-CM | POA: Diagnosis not present

## 2021-08-31 DIAGNOSIS — Z0000 Encounter for general adult medical examination without abnormal findings: Secondary | ICD-10-CM | POA: Diagnosis not present

## 2021-09-07 DIAGNOSIS — E1169 Type 2 diabetes mellitus with other specified complication: Secondary | ICD-10-CM | POA: Diagnosis not present

## 2021-09-07 DIAGNOSIS — N182 Chronic kidney disease, stage 2 (mild): Secondary | ICD-10-CM | POA: Diagnosis not present

## 2021-09-07 DIAGNOSIS — I2510 Atherosclerotic heart disease of native coronary artery without angina pectoris: Secondary | ICD-10-CM | POA: Diagnosis not present

## 2021-09-07 DIAGNOSIS — E782 Mixed hyperlipidemia: Secondary | ICD-10-CM | POA: Diagnosis not present

## 2021-09-21 DIAGNOSIS — H2513 Age-related nuclear cataract, bilateral: Secondary | ICD-10-CM | POA: Diagnosis not present

## 2021-09-21 DIAGNOSIS — H10413 Chronic giant papillary conjunctivitis, bilateral: Secondary | ICD-10-CM | POA: Diagnosis not present

## 2021-09-21 DIAGNOSIS — E119 Type 2 diabetes mellitus without complications: Secondary | ICD-10-CM | POA: Diagnosis not present

## 2021-12-06 DIAGNOSIS — G629 Polyneuropathy, unspecified: Secondary | ICD-10-CM | POA: Diagnosis not present

## 2021-12-06 DIAGNOSIS — I2510 Atherosclerotic heart disease of native coronary artery without angina pectoris: Secondary | ICD-10-CM | POA: Diagnosis not present

## 2021-12-06 DIAGNOSIS — K760 Fatty (change of) liver, not elsewhere classified: Secondary | ICD-10-CM | POA: Diagnosis not present

## 2021-12-06 DIAGNOSIS — E782 Mixed hyperlipidemia: Secondary | ICD-10-CM | POA: Diagnosis not present

## 2021-12-06 DIAGNOSIS — E1151 Type 2 diabetes mellitus with diabetic peripheral angiopathy without gangrene: Secondary | ICD-10-CM | POA: Diagnosis not present

## 2021-12-06 DIAGNOSIS — E1169 Type 2 diabetes mellitus with other specified complication: Secondary | ICD-10-CM | POA: Diagnosis not present

## 2021-12-14 DIAGNOSIS — E782 Mixed hyperlipidemia: Secondary | ICD-10-CM | POA: Diagnosis not present

## 2021-12-14 DIAGNOSIS — E1122 Type 2 diabetes mellitus with diabetic chronic kidney disease: Secondary | ICD-10-CM | POA: Diagnosis not present

## 2021-12-14 DIAGNOSIS — N182 Chronic kidney disease, stage 2 (mild): Secondary | ICD-10-CM | POA: Diagnosis not present

## 2021-12-14 DIAGNOSIS — I10 Essential (primary) hypertension: Secondary | ICD-10-CM | POA: Diagnosis not present

## 2021-12-19 DIAGNOSIS — L814 Other melanin hyperpigmentation: Secondary | ICD-10-CM | POA: Diagnosis not present

## 2021-12-19 DIAGNOSIS — L578 Other skin changes due to chronic exposure to nonionizing radiation: Secondary | ICD-10-CM | POA: Diagnosis not present

## 2021-12-19 DIAGNOSIS — L821 Other seborrheic keratosis: Secondary | ICD-10-CM | POA: Diagnosis not present

## 2021-12-19 DIAGNOSIS — D225 Melanocytic nevi of trunk: Secondary | ICD-10-CM | POA: Diagnosis not present

## 2021-12-19 DIAGNOSIS — L570 Actinic keratosis: Secondary | ICD-10-CM | POA: Diagnosis not present

## 2021-12-27 DIAGNOSIS — I739 Peripheral vascular disease, unspecified: Secondary | ICD-10-CM | POA: Diagnosis not present

## 2021-12-27 DIAGNOSIS — E1151 Type 2 diabetes mellitus with diabetic peripheral angiopathy without gangrene: Secondary | ICD-10-CM | POA: Diagnosis not present

## 2021-12-27 DIAGNOSIS — M21962 Unspecified acquired deformity of left lower leg: Secondary | ICD-10-CM | POA: Diagnosis not present

## 2022-01-03 DIAGNOSIS — M79645 Pain in left finger(s): Secondary | ICD-10-CM | POA: Diagnosis not present

## 2022-01-03 DIAGNOSIS — M79642 Pain in left hand: Secondary | ICD-10-CM | POA: Diagnosis not present

## 2022-01-03 DIAGNOSIS — S63617A Unspecified sprain of left little finger, initial encounter: Secondary | ICD-10-CM | POA: Diagnosis not present

## 2022-01-03 DIAGNOSIS — S66317S Strain of extensor muscle, fascia and tendon of left little finger at wrist and hand level, sequela: Secondary | ICD-10-CM | POA: Diagnosis not present

## 2022-01-31 DIAGNOSIS — S92501A Displaced unspecified fracture of right lesser toe(s), initial encounter for closed fracture: Secondary | ICD-10-CM | POA: Diagnosis not present

## 2022-01-31 DIAGNOSIS — M792 Neuralgia and neuritis, unspecified: Secondary | ICD-10-CM | POA: Diagnosis not present

## 2022-02-01 DIAGNOSIS — Z1231 Encounter for screening mammogram for malignant neoplasm of breast: Secondary | ICD-10-CM | POA: Diagnosis not present

## 2022-02-01 DIAGNOSIS — Z1501 Genetic susceptibility to malignant neoplasm of breast: Secondary | ICD-10-CM | POA: Diagnosis not present

## 2022-02-01 DIAGNOSIS — Z6828 Body mass index (BMI) 28.0-28.9, adult: Secondary | ICD-10-CM | POA: Diagnosis not present

## 2022-02-01 DIAGNOSIS — Z01419 Encounter for gynecological examination (general) (routine) without abnormal findings: Secondary | ICD-10-CM | POA: Diagnosis not present

## 2022-02-01 DIAGNOSIS — Z124 Encounter for screening for malignant neoplasm of cervix: Secondary | ICD-10-CM | POA: Diagnosis not present

## 2022-02-19 DIAGNOSIS — S92501D Displaced unspecified fracture of right lesser toe(s), subsequent encounter for fracture with routine healing: Secondary | ICD-10-CM | POA: Diagnosis not present

## 2022-02-19 DIAGNOSIS — M792 Neuralgia and neuritis, unspecified: Secondary | ICD-10-CM | POA: Diagnosis not present

## 2022-04-04 DIAGNOSIS — E1151 Type 2 diabetes mellitus with diabetic peripheral angiopathy without gangrene: Secondary | ICD-10-CM | POA: Diagnosis not present

## 2022-04-05 DIAGNOSIS — Z1509 Genetic susceptibility to other malignant neoplasm: Secondary | ICD-10-CM | POA: Diagnosis not present

## 2022-04-05 DIAGNOSIS — Z1501 Genetic susceptibility to malignant neoplasm of breast: Secondary | ICD-10-CM | POA: Diagnosis not present

## 2022-04-05 DIAGNOSIS — Z803 Family history of malignant neoplasm of breast: Secondary | ICD-10-CM | POA: Diagnosis not present

## 2022-05-09 DIAGNOSIS — Z0000 Encounter for general adult medical examination without abnormal findings: Secondary | ICD-10-CM | POA: Diagnosis not present

## 2022-05-15 DIAGNOSIS — Z780 Asymptomatic menopausal state: Secondary | ICD-10-CM | POA: Diagnosis not present

## 2022-05-15 DIAGNOSIS — E1122 Type 2 diabetes mellitus with diabetic chronic kidney disease: Secondary | ICD-10-CM | POA: Diagnosis not present

## 2022-05-15 DIAGNOSIS — I2510 Atherosclerotic heart disease of native coronary artery without angina pectoris: Secondary | ICD-10-CM | POA: Diagnosis not present

## 2022-05-15 DIAGNOSIS — Z0000 Encounter for general adult medical examination without abnormal findings: Secondary | ICD-10-CM | POA: Diagnosis not present

## 2022-05-15 DIAGNOSIS — N182 Chronic kidney disease, stage 2 (mild): Secondary | ICD-10-CM | POA: Diagnosis not present

## 2022-05-31 DIAGNOSIS — E782 Mixed hyperlipidemia: Secondary | ICD-10-CM | POA: Diagnosis not present

## 2022-05-31 DIAGNOSIS — Z955 Presence of coronary angioplasty implant and graft: Secondary | ICD-10-CM | POA: Diagnosis not present

## 2022-05-31 DIAGNOSIS — I10 Essential (primary) hypertension: Secondary | ICD-10-CM | POA: Diagnosis not present

## 2022-05-31 DIAGNOSIS — I2510 Atherosclerotic heart disease of native coronary artery without angina pectoris: Secondary | ICD-10-CM | POA: Diagnosis not present

## 2022-07-11 ENCOUNTER — Other Ambulatory Visit: Payer: Self-pay | Admitting: General Surgery

## 2022-07-11 DIAGNOSIS — Z1509 Genetic susceptibility to other malignant neoplasm: Secondary | ICD-10-CM

## 2022-07-11 DIAGNOSIS — G629 Polyneuropathy, unspecified: Secondary | ICD-10-CM | POA: Diagnosis not present

## 2022-08-16 ENCOUNTER — Ambulatory Visit
Admission: RE | Admit: 2022-08-16 | Discharge: 2022-08-16 | Disposition: A | Discharge status: Home / Self Care | Payer: BC Managed Care – PPO | Source: Ambulatory Visit | Attending: General Surgery | Admitting: General Surgery

## 2022-08-16 ENCOUNTER — Encounter: Payer: Self-pay | Admitting: General Surgery

## 2022-08-16 DIAGNOSIS — Z1239 Encounter for other screening for malignant neoplasm of breast: Secondary | ICD-10-CM | POA: Diagnosis not present

## 2022-08-16 DIAGNOSIS — Z1501 Genetic susceptibility to malignant neoplasm of breast: Secondary | ICD-10-CM

## 2022-08-16 MED ORDER — GADOPICLENOL 0.5 MMOL/ML IV SOLN
8.0000 mL | Freq: Once | INTRAVENOUS | Status: AC | PRN
  Administered 2022-08-16: 8 mL via INTRAVENOUS

## 2022-09-12 DIAGNOSIS — K760 Fatty (change of) liver, not elsewhere classified: Secondary | ICD-10-CM | POA: Diagnosis not present

## 2022-09-12 DIAGNOSIS — E782 Mixed hyperlipidemia: Secondary | ICD-10-CM | POA: Diagnosis not present

## 2022-09-12 DIAGNOSIS — E1122 Type 2 diabetes mellitus with diabetic chronic kidney disease: Secondary | ICD-10-CM | POA: Diagnosis not present

## 2022-09-12 DIAGNOSIS — I2510 Atherosclerotic heart disease of native coronary artery without angina pectoris: Secondary | ICD-10-CM | POA: Diagnosis not present

## 2022-09-12 DIAGNOSIS — N182 Chronic kidney disease, stage 2 (mild): Secondary | ICD-10-CM | POA: Diagnosis not present

## 2022-09-19 DIAGNOSIS — K760 Fatty (change of) liver, not elsewhere classified: Secondary | ICD-10-CM | POA: Diagnosis not present

## 2022-09-19 DIAGNOSIS — E1122 Type 2 diabetes mellitus with diabetic chronic kidney disease: Secondary | ICD-10-CM | POA: Diagnosis not present

## 2022-09-19 DIAGNOSIS — N182 Chronic kidney disease, stage 2 (mild): Secondary | ICD-10-CM | POA: Diagnosis not present

## 2022-09-19 DIAGNOSIS — I2510 Atherosclerotic heart disease of native coronary artery without angina pectoris: Secondary | ICD-10-CM | POA: Diagnosis not present

## 2022-09-26 DIAGNOSIS — H2513 Age-related nuclear cataract, bilateral: Secondary | ICD-10-CM | POA: Diagnosis not present

## 2022-09-26 DIAGNOSIS — E119 Type 2 diabetes mellitus without complications: Secondary | ICD-10-CM | POA: Diagnosis not present

## 2022-09-26 DIAGNOSIS — H10413 Chronic giant papillary conjunctivitis, bilateral: Secondary | ICD-10-CM | POA: Diagnosis not present

## 2022-12-04 DIAGNOSIS — R238 Other skin changes: Secondary | ICD-10-CM | POA: Diagnosis not present

## 2022-12-04 DIAGNOSIS — S5002XA Contusion of left elbow, initial encounter: Secondary | ICD-10-CM | POA: Diagnosis not present

## 2022-12-04 DIAGNOSIS — H6992 Unspecified Eustachian tube disorder, left ear: Secondary | ICD-10-CM | POA: Diagnosis not present

## 2022-12-04 DIAGNOSIS — M7711 Lateral epicondylitis, right elbow: Secondary | ICD-10-CM | POA: Diagnosis not present

## 2022-12-19 DIAGNOSIS — E782 Mixed hyperlipidemia: Secondary | ICD-10-CM | POA: Diagnosis not present

## 2022-12-19 DIAGNOSIS — I2510 Atherosclerotic heart disease of native coronary artery without angina pectoris: Secondary | ICD-10-CM | POA: Diagnosis not present

## 2022-12-19 DIAGNOSIS — N182 Chronic kidney disease, stage 2 (mild): Secondary | ICD-10-CM | POA: Diagnosis not present

## 2022-12-19 DIAGNOSIS — K760 Fatty (change of) liver, not elsewhere classified: Secondary | ICD-10-CM | POA: Diagnosis not present

## 2022-12-19 DIAGNOSIS — E1122 Type 2 diabetes mellitus with diabetic chronic kidney disease: Secondary | ICD-10-CM | POA: Diagnosis not present

## 2022-12-26 DIAGNOSIS — N182 Chronic kidney disease, stage 2 (mild): Secondary | ICD-10-CM | POA: Diagnosis not present

## 2022-12-26 DIAGNOSIS — K760 Fatty (change of) liver, not elsewhere classified: Secondary | ICD-10-CM | POA: Diagnosis not present

## 2022-12-26 DIAGNOSIS — I2510 Atherosclerotic heart disease of native coronary artery without angina pectoris: Secondary | ICD-10-CM | POA: Diagnosis not present

## 2022-12-26 DIAGNOSIS — E1122 Type 2 diabetes mellitus with diabetic chronic kidney disease: Secondary | ICD-10-CM | POA: Diagnosis not present

## 2023-01-02 ENCOUNTER — Telehealth: Payer: Self-pay | Admitting: Cardiovascular Disease

## 2023-01-02 NOTE — Telephone Encounter (Signed)
Pt dropped off recent lab results with concerns about some numbers if they are heart-related. Paperwork left in Dr. Marylene Buerger mailbox.  JB, 01-02-23

## 2023-03-22 ENCOUNTER — Other Ambulatory Visit: Payer: Self-pay | Admitting: Obstetrics

## 2023-03-22 DIAGNOSIS — Z13820 Encounter for screening for osteoporosis: Secondary | ICD-10-CM

## 2023-03-22 DIAGNOSIS — R5381 Other malaise: Secondary | ICD-10-CM

## 2023-04-08 NOTE — Progress Notes (Unsigned)
Cardiology Office Note:  .   Date:  04/10/2023  ID:  Sarah Ali, DOB November 09, 1955, MRN [id number] PCP: Georgianne Fick, MD  [facility] Providers Cardiologist:  Reatha Harps, MD { History of Present Illness: .   Sarah Ali is a 67 y.o. female with history of CAD, DM, HLD, HTN who presents for follow-up.    History of Present Illness   [name], a 67 year old female with a history of CAD, diabetes, hypertension, and hyperlipidemia, presents for a routine follow-up visit. The patient reports no current chest discomfort and her EKG shows normal sinus rhythm with no acute ischemic changes. She recalls a previous episode in 2021 when she experienced severe pain in both arms leading up to a hospital visit. Currently, she experiences similar pain only when leaning down, which she finds odd. However, she reports no other symptoms of angina.  The patient's blood pressure reading today is slightly elevated at 167/78, but she has had difficulty obtaining a reliable device to monitor it at home. Her most recent blood pressure reading at her primary care physician's office in August was 136/80.  Her diabetes is well-controlled with a recent A1C of 6.5. She also reports that her cholesterol levels are well-managed with Repatha, although her triglycerides are slightly elevated at 200. She is currently on atorvastatin, which her primary care physician is gradually reducing from 80mg  to 20mg .  The patient is also on dual antiplatelet therapy (DAPT) with aspirin and Effient (prasugrel) following her previous cardiac episode. She reports some bruising, a potential side effect of the DAPT.  In terms of lifestyle, the patient is retired but remains active through volunteering. She is not currently engaging in formal exercise but is tracking her steps, aiming for 4,500 steps per day.          Problem List 1. STEMI -11/2019 -DES to d RCA (occluded old stent) -60% mid LAD -40% LCX -post  infarct pericarditis 2. DM -A1c 6.5 3. HLD -LDL 6, T chol 81, HDL 44, TG 200 4. HTN    ROS: All other ROS reviewed and negative. Pertinent positives noted in the HPI.     Studies Reviewed: Marland Kitchen   EKG Interpretation Date/Time:  Wednesday April 10 2023 09:29:03 EST Ventricular Rate:  69 PR Interval:  128 QRS Duration:  86 QT Interval:  418 QTC Calculation: 447 R Axis:   36  Text Interpretation: Normal sinus rhythm Normal ECG Confirmed by Lennie Odor [phone]) on 04/10/2023 9:30:47 AM   Physical Exam:   VS:  BP (!) 167/80 (BP Location: Left Arm, Patient Position: Sitting, Cuff Size: Large)   Pulse 70   Ht 5\' 6"  (1.676 m)   Wt 180 lb 6.4 oz (81.8 kg)   SpO2 96%   BMI 29.12 kg/m    Wt Readings from Last 3 Encounters:  04/10/23 180 lb 6.4 oz (81.8 kg)  03/20/21 183 lb 6.4 oz (83.2 kg)  11/28/20 180 lb 12.8 oz (82 kg)    GEN: Well nourished, well developed in no acute distress NECK: No JVD; No carotid bruits CARDIAC: RRR, no murmurs, rubs, gallops RESPIRATORY:  Clear to auscultation without rales, wheezing or rhonchi  ABDOMEN: Soft, non-tender, non-distended EXTREMITIES:  No edema; No deformity  ASSESSMENT AND PLAN: .   Assessment and Plan    Coronary Artery Disease Asymptomatic with normal EKG. Currently on dual antiplatelet therapy (DAPT) with aspirin and prasugrel. -Discontinue prasugrel after current supply runs out and continue aspirin indefinitely.  Hypertension Elevated blood pressure  readings today (167/78 and 165/90), but previous control noted at primary care visit in August (136/80). Currently on Benicar 40mg  daily and Carvedilol 25mg  BID. -Check blood pressure daily and maintain a log. -Return for follow-up in 3 months to reassess blood pressure control.  Diabetes Mellitus Recent HbA1c of 6.5, indicating good control. -Continue current management.  Hyperlipidemia Recent LDL of 6, indicating excellent control. Currently on Repatha and Atorvastatin  (recently reduced to 20mg  by primary care physician). -Continue current management. -for now conservative tx of TGs. Consider vascepa at follow-up.   Follow-up Return for follow-up in 3 months for blood pressure reassessment and in 1 year for routine cardiology follow-up.              Follow-up: Return in about 3 months (around 07/09/2023).  Time Spent with Patient: I have spent a total of 35 minutes caring for this patient today face to face, ordering and reviewing labs/tests, reviewing prior records/medical history, examining the patient, establishing an assessment and plan, communicating results/findings to the patient/family, and documenting in the medical record.   Signed, Lenna Gilford. Flora Lipps, MD, [facility]  [facility]  [address], [address] [place], [place] [address] [phone]  9:58 AM

## 2023-04-10 ENCOUNTER — Ambulatory Visit: Payer: Medicare Other | Attending: Cardiovascular Disease | Admitting: Cardiovascular Disease

## 2023-04-10 ENCOUNTER — Encounter: Payer: Self-pay | Admitting: Cardiovascular Disease

## 2023-04-10 VITALS — BP 167/80 | HR 70 | Ht 66.0 in | Wt 180.4 lb

## 2023-04-10 DIAGNOSIS — E782 Mixed hyperlipidemia: Secondary | ICD-10-CM | POA: Diagnosis present

## 2023-04-10 DIAGNOSIS — I2510 Atherosclerotic heart disease of native coronary artery without angina pectoris: Secondary | ICD-10-CM

## 2023-04-10 NOTE — Patient Instructions (Signed)
Medication Instructions:  - STOP PRASUGREL   *If you need a refill on your cardiac medications before your next appointment, please call your pharmacy*   Lab Work: NONE    If you have labs (blood work) drawn today and your tests are completely normal, you will receive your results only by: MyChart Message (if you have MyChart) OR A paper copy in the mail If you have any lab test that is abnormal or we need to change your treatment, we will call you to review the results.   Testing/Procedures: NONE    Follow-Up: At [facility], you and your health needs are our priority.  As part of our continuing mission to provide you with exceptional heart care, we have created designated Provider Care Teams.  These Care Teams include your primary Cardiologist (physician) and Advanced Practice Providers (APPs -  Physician Assistants and Nurse Practitioners) who all work together to provide you with the care you need, when you need it.  We recommend signing up for the patient portal called "MyChart".  Sign up information is provided on this After Visit Summary.  MyChart is used to connect with patients for Virtual Visits (Telemedicine).  Patients are able to view lab/test results, encounter notes, upcoming appointments, etc.  Non-urgent messages can be sent to your provider as well.   To learn more about what you can do with MyChart, go to [email or web address].    Your next appointment:   3 month(s)  The format for your next appointment:   In Person  Provider:   Edd Fabian, FNP, Micah Flesher, PA-C, Marjie Skiff, PA-C, Robet Leu, PA-C, Juanda Crumble, PA-C, Joni Reining, DNP, ANP, Azalee Course, PA-C, Bernadene Person, NP, or Reather Littler, NP    Then, Reatha Harps, MD will plan to see you again in 1 year(s).   Other Instructions CHECK BLOODPRESSURE DAILY. KEEP A LOG OF READINGS UNTIL NEXT APPOINTMENT IN 3 MONTHS. GOAL IS BLOODPRESSURE LESS THAN 130/80.

## 2023-04-12 ENCOUNTER — Ambulatory Visit
Admission: RE | Admit: 2023-04-12 | Discharge: 2023-04-12 | Disposition: A | Discharge status: Home / Self Care | Payer: Medicare Other | Source: Ambulatory Visit | Attending: Obstetrics | Admitting: Obstetrics

## 2023-04-12 DIAGNOSIS — Z13820 Encounter for screening for osteoporosis: Secondary | ICD-10-CM

## 2023-04-12 DIAGNOSIS — R5381 Other malaise: Secondary | ICD-10-CM

## 2023-07-15 ENCOUNTER — Ambulatory Visit: Payer: Medicare Other | Admitting: Nurse Practitioner

## 2023-07-16 ENCOUNTER — Other Ambulatory Visit: Payer: Self-pay | Admitting: General Surgery

## 2023-07-16 DIAGNOSIS — Z1509 Genetic susceptibility to other malignant neoplasm: Secondary | ICD-10-CM

## 2023-07-25 ENCOUNTER — Encounter: Payer: Self-pay | Admitting: Nurse Practitioner

## 2023-07-25 ENCOUNTER — Ambulatory Visit: Payer: Medicare Other | Attending: Nurse Practitioner | Admitting: Nurse Practitioner

## 2023-07-25 VITALS — BP 152/78 | HR 71 | Ht 66.0 in | Wt 179.0 lb

## 2023-07-25 DIAGNOSIS — I10 Essential (primary) hypertension: Secondary | ICD-10-CM | POA: Insufficient documentation

## 2023-07-25 DIAGNOSIS — E785 Hyperlipidemia, unspecified: Secondary | ICD-10-CM | POA: Diagnosis present

## 2023-07-25 DIAGNOSIS — E119 Type 2 diabetes mellitus without complications: Secondary | ICD-10-CM | POA: Diagnosis present

## 2023-07-25 DIAGNOSIS — I2510 Atherosclerotic heart disease of native coronary artery without angina pectoris: Secondary | ICD-10-CM | POA: Insufficient documentation

## 2023-07-25 MED ORDER — NITROGLYCERIN 0.4 MG SL SUBL: 0.4000 mg | SUBLINGUAL_TABLET | SUBLINGUAL | 3 refills | Status: DC | PRN

## 2023-07-25 MED ORDER — CARVEDILOL 25 MG PO TABS: 25.0000 mg | ORAL_TABLET | Freq: Two times a day (BID) | ORAL | 3 refills | Status: DC

## 2023-07-25 MED ORDER — AMLODIPINE BESYLATE 5 MG PO TABS: 7.5000 mg | ORAL_TABLET | Freq: Every day | ORAL | 3 refills | Status: DC

## 2023-07-25 NOTE — Patient Instructions (Addendum)
 Medication Instructions:  Increase Amlodipine 7.5 mg   *If you need a refill on your cardiac medications before your next appointment, please call your pharmacy*   Lab Work: NONE ordered at this time of appointment   Testing/Procedures: NONE ordered at this time of appointment   Follow-Up: At [facility], you and your health needs are our priority.  As part of our continuing mission to provide you with exceptional heart care, we have created designated Provider Care Teams.  These Care Teams include your primary Cardiologist (physician) and Advanced Practice Providers (APPs -  Physician Assistants and Nurse Practitioners) who all work together to provide you with the care you need, when you need it.  We recommend signing up for the patient portal called "MyChart".  Sign up information is provided on this After Visit Summary.  MyChart is used to connect with patients for Virtual Visits (Telemedicine).  Patients are able to view lab/test results, encounter notes, upcoming appointments, etc.  Non-urgent messages can be sent to your provider as well.   To learn more about what you can do with MyChart, go to [email or web address].    Your next appointment:   3 month(s)  Provider:   Reatha Harps, MD  or Bernadene Person, NP        Other Instructions Monitor blood pressure. Report BP consistently greater than 130/80 or more. Consider starting Chlorthalidone if Blood pressure doesn't improve.    1st Floor: - Lobby - Registration  - Pharmacy  - Lab - Cafe  2nd Floor: - PV Lab - Diagnostic Testing (echo, CT, nuclear med)  3rd Floor: - Vacant  4th Floor: - TCTS (cardiothoracic surgery) - AFib Clinic - Structural Heart Clinic - Vascular Surgery  - Vascular Ultrasound  5th Floor: - HeartCare Cardiology (general and EP) - Clinical Pharmacy for coumadin, hypertension, lipid, weight-loss medications, and med management appointments    Valet parking services will be  available as well.

## 2023-07-25 NOTE — Progress Notes (Signed)
 Office Visit    Patient Name: Sarah Ali Date of Encounter: 07/25/2023  Primary Care Provider:  Georgianne Fick, MD Primary Cardiologist:  Reatha Harps, MD  Chief Complaint    68 year old female with a history of CAD s/p prior stenting-RCA, DES-distal RCA, PTCA-ostium of RCA in 11/2019, hypertension, hyperlipidemia, and type 2 diabetes who presents for follow-up related to CAD.  Past Medical History    Past Medical History:  Diagnosis Date   Asthma    childhood   BRCA1 positive    Bronchitis    Coronary artery disease    Diabetes mellitus without complication (HCC)    Eczema    Family history of breast cancer    Family history of ovarian cancer    High cholesterol    Hypertension    Pneumonia    Past Surgical History:  Procedure Laterality Date   BREAST BIOPSY     BREAST SURGERY  07/05/2016   biopsy   COLONOSCOPY     CORONARY ANGIOPLASTY  2015   CORONARY/GRAFT ACUTE MI REVASCULARIZATION N/A 11/16/2019   Procedure: Coronary/Graft Acute MI Revascularization;  Surgeon: Yvonne Kendall, MD;  Location: [facility];  Service: Cardiovascular;  Laterality: N/A;   DILATATION & CURRETTAGE/HYSTEROSCOPY WITH RESECTOCOPE N/A 07/19/2016   Procedure: DILATATION & CURETTAGE/HYSTEROSCOPY WITH RESECTION OF ENDOMETRIAL POLYP WITH MYOSURE;  Surgeon: Maxie Better, MD;  Location: [facility];  Service: Gynecology;  Laterality: N/A;   LEFT HEART CATH AND CORONARY ANGIOGRAPHY N/A 11/16/2019   Procedure: LEFT HEART CATH AND CORONARY ANGIOGRAPHY;  Surgeon: Yvonne Kendall, MD;  Location: [facility];  Service: Cardiovascular;  Laterality: N/A;   ROBOTIC ASSISTED BILATERAL SALPINGO OOPHERECTOMY Bilateral 07/19/2016   Procedure: ROBOTIC ASSISTED BILATERAL SALPINGO OOPHORECTOMY AND LYSIS OF ADHESIONS, PELVIC WASHINGS;  Surgeon: Maxie Better, MD;  Location: [facility];  Service: Gynecology;  Laterality: Bilateral;  DO NOT DRAPE THE ROBOT     Allergies  Allergies  Allergen  Reactions   Cat Dander     Other Reaction(s): Not available, Other (See Comments)   Dust Mite Extract     Other Reaction(s): Other (See Comments)     Labs/Other Studies Reviewed    The following studies were reviewed today:  Cardiac Studies & Procedures   ______________________________________________________________________________________________ CARDIAC CATHETERIZATION  CARDIAC CATHETERIZATION 11/16/2019  Narrative Conclusions: 1. Severe single-vessel coronary artery disease with thrombotic occlusion of the distal RCA involving the distal segment of old stent as well as unstented vessel beyond the stent.  There is also 80% stenosis involving the ostium of the RPDA. 2. Moderate, noncritical disease involving the mid LAD, mid LCx, and proximal RCA of up to 50-60%. 3. Low normal left ventricular contraction with basal and mid inferior hypokinesis.  LVEF 50-55%. 4. Mildly elevated left ventricular filling pressure. 5. Successful PCI to distal RCA using a Synergy 2.5 x 24 mm drug-eluting stent extending from the distal half of the old stent to just before the bifurcation (postdilated to 2.9 mm) with 0% residual stenosis and TIMI-3 flow. 6. Successful PTCA to the ostium of RCA using Emerge 2.0 x 12 mm balloon with reduction of stenosis from 80% to 20%.  Recommendations: 1. Admit to cardiac ICU for post STEMI monitoring. 2. Continue tirofiban infusion for 4 hours. 3. Dual antiplatelet therapy with aspirin and prasugrel for at least 12 months.  Given overlapping stents in the distal RCA and very late stent thrombosis, consider long-term DAPT. 4. Medical therapy of noncritical disease involving the LAD, LCx, and proximal RCA.  Functional study to assess hemodynamic significance of LAD disease should be considered when patient has recovered from acute MI. 5. Aggressive secondary prevention.  Yvonne Kendall, MD [facility]  Findings Coronary Findings Diagnostic  Dominance:  Right  Left Main Vessel is large. Vessel is angiographically normal.  Left Anterior Descending Vessel is large. Mid LAD-1 lesion is 50% stenosed. Mid LAD-2 lesion is 60% stenosed.  First Diagonal Branch Vessel is large in size.  Second Diagonal Branch Vessel is moderate in size.  Ramus Intermedius Vessel is small.  Left Circumflex Vessel is moderate in size. Prox Cx to Mid Cx lesion is 40% stenosed.  First Obtuse Marginal Branch Vessel is moderate in size.  Right Coronary Artery Vessel is moderate in size. Prox RCA lesion is 50% stenosed. The lesion is eccentric. Dist RCA-1 lesion is 100% stenosed. The lesion was previously treated using a drug eluting stent over 2 years ago. Dist RCA-2 lesion is 90% stenosed.  Right Posterior Descending Artery Vessel is moderate in size. RPDA lesion is 80% stenosed.  Right Posterior Atrioventricular Artery Vessel is moderate in size.  Intervention  Dist RCA-1 lesion Stent (Also treats lesions: Dist RCA-2) A drug-eluting stent was successfully placed using a SYNERGY XD 2.50X24. Post-Intervention Lesion Assessment The intervention was successful. Pre-interventional TIMI flow is 0. Post-intervention TIMI flow is 3. No complications occurred at this lesion. There is a 0% residual stenosis post intervention.  Dist RCA-2 lesion Stent (Also treats lesions: Dist RCA-1) See details in Dist RCA-1 lesion. Post-Intervention Lesion Assessment The intervention was successful. Pre-interventional TIMI flow is 0. Post-intervention TIMI flow is 3. No complications occurred at this lesion. There is a 0% residual stenosis post intervention.  RPDA lesion Angioplasty Balloon angioplasty was performed using a BALLOON EMERGE MR 2.0X12. Post-Intervention Lesion Assessment The intervention was successful. Pre-interventional TIMI flow is 0. Post-intervention TIMI flow is 3. No complications occurred at this lesion. There is a 20% residual stenosis  post intervention.     ECHOCARDIOGRAM  ECHOCARDIOGRAM COMPLETE 11/16/2019  Narrative ECHOCARDIOGRAM REPORT    Patient Name:   Sarah Ali Date of Exam: 11/16/2019 Medical Rec #:  [id number]    Height:       66.0 in Accession #:    [id number]   Weight:       180.0 lb Date of Birth:  16-Oct-1955     BSA:          1.912 m Patient Age:    63 years     BP:           95/59 mmHg Patient Gender: F            HR:           64 bpm. Exam Location:  Inpatient  Procedure: 2D Echo, Color Doppler and Cardiac Doppler  Indications:    STEMI  History:        Patient has prior history of Echocardiogram examinations, most recent 11/26/2013. STEMI; Risk Factors:Hypertension, Diabetes and Dyslipidemia. Prior performed at [facility].  Sonographer:    Irving Burton Senior RDCS Referring Phys: [phone] [facility]  IMPRESSIONS   1. Normal LV function; grade 1 diastolic dysfunction; mild LVH; small pericardial effusion. 2. Left ventricular ejection fraction, by estimation, is 55 to 60%. The left ventricle has normal function. The left ventricle has no regional wall motion abnormalities. There is mild left ventricular hypertrophy. Left ventricular diastolic parameters are consistent with Grade I diastolic dysfunction (impaired relaxation). 3. Right ventricular systolic function is normal. The right ventricular  size is normal. 4. The mitral valve is normal in structure. No evidence of mitral valve regurgitation. No evidence of mitral stenosis. 5. The aortic valve is tricuspid. Aortic valve regurgitation is not visualized. No aortic stenosis is present. 6. The inferior vena cava is normal in size with greater than 50% respiratory variability, suggesting right atrial pressure of 3 mmHg.  FINDINGS Left Ventricle: Left ventricular ejection fraction, by estimation, is 55 to 60%. The left ventricle has normal function. The left ventricle has no regional wall motion abnormalities. The left ventricular internal cavity size was  normal in size. There is mild left ventricular hypertrophy. Left ventricular diastolic parameters are consistent with Grade I diastolic dysfunction (impaired relaxation).  Right Ventricle: The right ventricular size is normal.Right ventricular systolic function is normal.  Left Atrium: Left atrial size was normal in size.  Right Atrium: Right atrial size was normal in size.  Pericardium: A small pericardial effusion is present.  Mitral Valve: The mitral valve is normal in structure. Normal mobility of the mitral valve leaflets. No evidence of mitral valve regurgitation. No evidence of mitral valve stenosis.  Tricuspid Valve: The tricuspid valve is normal in structure. Tricuspid valve regurgitation is trivial. No evidence of tricuspid stenosis.  Aortic Valve: The aortic valve is tricuspid. Aortic valve regurgitation is not visualized. No aortic stenosis is present.  Pulmonic Valve: The pulmonic valve was normal in structure. Pulmonic valve regurgitation is not visualized. No evidence of pulmonic stenosis.  Aorta: The aortic root is normal in size and structure.  Venous: The inferior vena cava is normal in size with greater than 50% respiratory variability, suggesting right atrial pressure of 3 mmHg.  IAS/Shunts: No atrial level shunt detected by color flow Doppler.  Additional Comments: Normal LV function; grade 1 diastolic dysfunction; mild LVH; small pericardial effusion.   LEFT VENTRICLE PLAX 2D LVIDd:         4.23 cm  Diastology LVIDs:         2.58 cm  LV e' lateral:   7.29 cm/s LV PW:         1.24 cm  LV E/e' lateral: 7.7 LV IVS:        1.14 cm  LV e' medial:    4.79 cm/s LVOT diam:     1.80 cm  LV E/e' medial:  11.8 LV SV:         50 LV SV Index:   26 LVOT Area:     2.54 cm   RIGHT VENTRICLE RV S prime:     18.30 cm/s TAPSE (M-mode): 2.3 cm  LEFT ATRIUM             Index       RIGHT ATRIUM           Index LA diam:        3.20 cm 1.67 cm/m  RA Area:     12.20  cm LA Vol (A2C):   36.4 ml 19.04 ml/m RA Volume:   27.90 ml  14.59 ml/m LA Vol (A4C):   42.6 ml 22.28 ml/m LA Biplane Vol: 40.4 ml 21.13 ml/m AORTIC VALVE LVOT Vmax:   85.70 cm/s LVOT Vmean:  57.400 cm/s LVOT VTI:    0.195 m  AORTA Ao Root diam: 2.80 cm Ao Asc diam:  2.80 cm  MITRAL VALVE MV Area (PHT): 3.60 cm    SHUNTS MV Decel Time: 211 msec    Systemic VTI:  0.20 m MV E velocity: 56.30 cm/s  Systemic Diam:  1.80 cm MV A velocity: 72.30 cm/s MV E/A ratio:  0.78  Olga Millers MD Electronically signed by Olga Millers MD Signature Date/Time: 11/16/2019/4:25:04 PM    Final          ______________________________________________________________________________________________     Recent Labs: No results found for requested labs within last 365 days.  Recent Lipid Panel    Component Value Date/Time   CHOL 152 01/08/2020 0826   TRIG 139 01/08/2020 0826   HDL 42 01/08/2020 0826   CHOLHDL 3.6 01/08/2020 0826   CHOLHDL 4.2 11/16/2019 1102   VLDL 57 (H) 11/16/2019 1102   LDLCALC 85 01/08/2020 0826    History of Present Illness    68 year old female with the above past medical history including CAD s/p prior stenting-RCA, DES-distal RCA, PTCA-ostium of RCA in 11/2019, hypertension, hyperlipidemia, and type 2 diabetes.  She history of CAD with prior stenting to her RCA.  She was hospitalized in July 2021 in the setting STEMI. Cardiac catheterization revealed severe single-vessel coronary artery disease with thrombotic occlusion of the distal RCA s/p DES-distal RCA extending from the distal half of prior stent, s/p PTCA-ostium of RCA with reduction of stenosis from 80% to 20%.  There is moderate, noncritical disease involving the mid LAD, mid LCx, and proximal RCA up to 50 to 60%, low normal LV function, LVEF 50 to 55%.  Echocardiogram in 11/2019 showed EF 55 to 60%, normal LV function, no RWMA, mild LVH, G1 DD, normal RV systolic function, no significant valvular  abnormalities.  She was last seen in the office on 04/10/2023 and was stable from a cardiac standpoint.  BP was elevated.  Ongoing monitoring of BP was advised.  She presents today for follow-up.  Since her last visit able from a cardiac standpoint.  She denies any symptoms concerning for angina.  She started taking amlodipine 5 mg daily approximately 1 month ago per PCP, BP has remained elevated above goal.  We compared home BP cuff to manual reading in office, readings were fairly similar.   Home Medications    Current Outpatient Medications  Medication Sig Dispense Refill   aspirin EC 81 MG tablet Take 1 tablet (81 mg total) by mouth daily. Swallow whole. 90 tablet 3   atorvastatin (LIPITOR) 40 MG tablet Take 40 mg by mouth daily.     atorvastatin (LIPITOR) 40 MG tablet Take 40 mg by mouth daily.     Cholecalciferol (VITAMIN D) 2000 units tablet Take 2,000 Units by mouth daily.     Dulaglutide (TRULICITY) 1.5 MG/0.5ML SOAJ Inject 1.5 mg into the skin once a week.     Evolocumab (REPATHA SURECLICK) 140 MG/ML SOAJ 140 mg     fexofenadine (ALLEGRA ALLERGY) 180 MG tablet Take 180 mg by mouth daily.     JANUVIA 100 MG tablet Take 100 mg by mouth daily.     metFORMIN (GLUCOPHAGE-XR) 500 MG 24 hr tablet Take 1,000 mg by mouth 2 (two) times daily.     Multiple Vitamins-Minerals (MULTIVITAMIN WITH MINERALS) tablet Take 1 tablet by mouth daily.     olmesartan (BENICAR) 40 MG tablet Take 40 mg by mouth daily.     prasugrel (EFFIENT) 10 MG TABS tablet Take 1 tablet by mouth daily.     vitamin E 400 UNIT capsule Take 400 Units by mouth daily.     amLODipine (NORVASC) 5 MG tablet Take 1.5 tablets (7.5 mg total) by mouth daily. 135 tablet 3   carvedilol (COREG) 25 MG tablet Take 1 tablet (  25 mg total) by mouth 2 (two) times daily with a meal. 180 tablet 3   nitroGLYCERIN (NITROSTAT) 0.4 MG SL tablet Place 1 tablet (0.4 mg total) under the tongue every 5 (five) minutes as needed for chest pain. 25 tablet 3    tiZANidine (ZANAFLEX) 4 MG tablet Take 4 mg by mouth 3 (three) times daily as needed. (Patient not taking: Reported on 07/25/2023)     No current facility-administered medications for this visit.     Review of Systems    She denies chest pain, palpitations, dyspnea, pnd, orthopnea, n, v, dizziness, syncope, edema, weight gain, or early satiety. All other systems reviewed and are otherwise negative except as noted above.   Physical Exam    VS:  BP (!) 152/78   Pulse 71   Ht 5\' 6"  (1.676 m)   Wt 179 lb (81.2 kg)   SpO2 98%   BMI 28.89 kg/m   GEN: Well nourished, well developed, in no acute distress. HEENT: normal. Neck: Supple, no JVD, carotid bruits, or masses. Cardiac: RRR, no murmurs, rubs, or gallops. No clubbing, cyanosis, edema.  Radials/DP/PT 2+ and equal bilaterally.  Respiratory:  Respirations regular and unlabored, clear to auscultation bilaterally. GI: Soft, nontender, nondistended, BS + x 4. MS: no deformity or atrophy. Skin: warm and dry, no rash. Neuro:  Strength and sensation are intact. Psych: Normal affect.  Accessory Clinical Findings    ECG personally reviewed by me today -    - no EKG in office today.   Lab Results  Component Value Date   WBC 5.0 11/18/2019   HGB 12.0 11/18/2019   HCT 35.0 (L) 11/18/2019   MCV 89.5 11/18/2019   PLT 146 (L) 11/18/2019   Lab Results  Component Value Date   CREATININE 0.93 11/18/2019   BUN 19 11/18/2019   NA 139 11/18/2019   K 4.1 11/18/2019   CL 105 11/18/2019   CO2 24 11/18/2019   Lab Results  Component Value Date   ALT 51 (H) 01/08/2020   AST 25 01/08/2020   ALKPHOS 126 (H) 01/08/2020   BILITOT 0.5 01/08/2020   Lab Results  Component Value Date   CHOL 152 01/08/2020   HDL 42 01/08/2020   LDLCALC 85 01/08/2020   TRIG 139 01/08/2020   CHOLHDL 3.6 01/08/2020    Lab Results  Component Value Date   HGBA1C 6.8 (H) 11/16/2019    Assessment & Plan   1. CAD: S/p prior stenting-RCA, DES-distal RCA,  PTCA-ostium of RCA in 11/2019. Stable with no anginal symptoms. No indication for ischemic evaluation.  Continue aspirin, carvedilol, olmesartan, amlodipine, Lipitor, and Repatha.  Dr. Flora Lipps previously recommended discontinuation of prasugrel upon completion of prescription. However, patient prefers to continue the medication.  I will reach out to Dr. Flora Lipps to make sure he is agreeable to this.   2. Hypertension: BP remains elevated above goal. Will increase amlodipine to 7.5 mg daily.  Continue to monitor BP and report BP consistently greater than 130/80. If BP remains elevated above goal, consider addition of low-dose chlorthalidone.  Continue carvedilol, olmesartan.    3. Hyperlipidemia: LDL was 11 in 05/2023. Continue Repatha, Lipitor.   4. Type 2 diabetes: A1c was 6.7 in 05/2023. Monitored and managed per PCP.   5. Disposition: Follow-up in 3 months.  HYPERTENSION CONTROL Vitals:   07/25/23 1054 07/25/23 1110  BP: (!) 140/72 (!) 152/78    The patient's blood pressure is elevated above target today.  In order to address the  patient's elevated BP: Blood pressure will be monitored at home to determine if medication changes need to be made.; A current anti-hypertensive medication was adjusted today.; Follow up with general cardiology has been recommended.      Joylene Grapes, NP 07/25/2023, 4:27 PM

## 2023-09-04 ENCOUNTER — Ambulatory Visit
Admission: RE | Admit: 2023-09-04 | Discharge: 2023-09-04 | Disposition: A | Discharge status: Home / Self Care | Source: Ambulatory Visit | Attending: General Surgery | Admitting: General Surgery

## 2023-09-04 DIAGNOSIS — Z1509 Genetic susceptibility to other malignant neoplasm: Secondary | ICD-10-CM

## 2023-09-04 MED ORDER — GADOPICLENOL 0.5 MMOL/ML IV SOLN
8.0000 mL | Freq: Once | INTRAVENOUS | Status: AC | PRN
  Administered 2023-09-04: 8 mL via INTRAVENOUS

## 2023-09-07 ENCOUNTER — Other Ambulatory Visit

## 2023-10-10 ENCOUNTER — Encounter: Payer: Self-pay | Admitting: Nurse Practitioner

## 2023-10-10 ENCOUNTER — Ambulatory Visit: Attending: Nurse Practitioner | Admitting: Nurse Practitioner

## 2023-10-10 VITALS — BP 128/62 | HR 72 | Ht 66.0 in | Wt 180.0 lb

## 2023-10-10 DIAGNOSIS — E119 Type 2 diabetes mellitus without complications: Secondary | ICD-10-CM | POA: Insufficient documentation

## 2023-10-10 DIAGNOSIS — I2510 Atherosclerotic heart disease of native coronary artery without angina pectoris: Secondary | ICD-10-CM | POA: Insufficient documentation

## 2023-10-10 DIAGNOSIS — E785 Hyperlipidemia, unspecified: Secondary | ICD-10-CM | POA: Insufficient documentation

## 2023-10-10 DIAGNOSIS — I10 Essential (primary) hypertension: Secondary | ICD-10-CM | POA: Insufficient documentation

## 2023-10-10 MED ORDER — AMLODIPINE BESYLATE 2.5 MG PO TABS: 2.5000 mg | ORAL_TABLET | Freq: Every day | ORAL | 3 refills | Status: AC

## 2023-10-10 NOTE — Patient Instructions (Signed)
 Medication Instructions:  CONTINUE AMLODIPINE  7.5 MG (TAKE ONE 5 MG TABLET DAILY & TAKE ONE 2.5 MG DAILY)   Follow-Up: At [facility], you and your health needs are our priority.  As part of our continuing mission to provide you with exceptional heart care, our providers are all part of one team.  This team includes your primary Cardiologist (physician) and Advanced Practice Providers or APPs (Physician Assistants and Nurse Practitioners) who all work together to provide you with the care you need, when you need it.  Your next appointment:   5-6 month(s)  Provider:   Oneil Bigness, MD or Marlana Silvan   We recommend signing up for the patient portal called "MyChart".  Sign up information is provided on this After Visit Summary.  MyChart is used to connect with patients for Virtual Visits (Telemedicine).  Patients are able to view lab/test results, encounter notes, upcoming appointments, etc.  Non-urgent messages can be sent to your provider as well.   To learn more about what you can do with MyChart, go to [email or web address].

## 2023-10-10 NOTE — Progress Notes (Unsigned)
 Office Visit    Patient Name: Sarah Ali Date of Encounter: 10/10/2023  Primary Care Provider:  Virgle Grime, MD Primary Cardiologist:  Oneil Bigness, MD  Chief Complaint    68 year old female with a history of CAD s/p prior stenting-RCA, DES-distal RCA, PTCA-ostium of RCA in 11/2019, hypertension, hyperlipidemia, and type 2 diabetes who presents for follow-up related to CAD and hypertension.  Past Medical History    Past Medical History:  Diagnosis Date   Asthma    childhood   BRCA1 positive    Bronchitis    Coronary artery disease    Diabetes mellitus without complication (HCC)    Eczema    Family history of breast cancer    Family history of ovarian cancer    High cholesterol    Hypertension    Pneumonia    Past Surgical History:  Procedure Laterality Date   BREAST BIOPSY     BREAST SURGERY  07/05/2016   biopsy   COLONOSCOPY     CORONARY ANGIOPLASTY  2015   CORONARY/GRAFT ACUTE MI REVASCULARIZATION N/A 11/16/2019   Procedure: Coronary/Graft Acute MI Revascularization;  Surgeon: Sammy Crisp, MD;  Location: [facility];  Service: Cardiovascular;  Laterality: N/A;   DILATATION & CURRETTAGE/HYSTEROSCOPY WITH RESECTOCOPE N/A 07/19/2016   Procedure: DILATATION & CURETTAGE/HYSTEROSCOPY WITH RESECTION OF ENDOMETRIAL POLYP WITH MYOSURE;  Surgeon: Ivery Marking, MD;  Location: [facility];  Service: Gynecology;  Laterality: N/A;   LEFT HEART CATH AND CORONARY ANGIOGRAPHY N/A 11/16/2019   Procedure: LEFT HEART CATH AND CORONARY ANGIOGRAPHY;  Surgeon: Sammy Crisp, MD;  Location: [facility];  Service: Cardiovascular;  Laterality: N/A;   ROBOTIC ASSISTED BILATERAL SALPINGO OOPHERECTOMY Bilateral 07/19/2016   Procedure: ROBOTIC ASSISTED BILATERAL SALPINGO OOPHORECTOMY AND LYSIS OF ADHESIONS, PELVIC WASHINGS;  Surgeon: Ivery Marking, MD;  Location: [facility];  Service: Gynecology;  Laterality: Bilateral;  DO NOT DRAPE THE ROBOT      Allergies  Allergies  Allergen Reactions   Cat Dander     Other Reaction(s): Not available, Other (See Comments)   Dust Mite Extract     Other Reaction(s): Other (See Comments)     Labs/Other Studies Reviewed    The following studies were reviewed today:  Cardiac Studies & Procedures   ______________________________________________________________________________________________ CARDIAC CATHETERIZATION  CARDIAC CATHETERIZATION 11/16/2019  Conclusion Conclusions: 1. Severe single-vessel coronary artery disease with thrombotic occlusion of the distal RCA involving the distal segment of old stent as well as unstented vessel beyond the stent.  There is also 80% stenosis involving the ostium of the RPDA. 2. Moderate, noncritical disease involving the mid LAD, mid LCx, and proximal RCA of up to 50-60%. 3. Low normal left ventricular contraction with basal and mid inferior hypokinesis.  LVEF 50-55%. 4. Mildly elevated left ventricular filling pressure. 5. Successful PCI to distal RCA using a Synergy 2.5 x 24 mm drug-eluting stent extending from the distal half of the old stent to just before the bifurcation (postdilated to 2.9 mm) with 0% residual stenosis and TIMI-3 flow. 6. Successful PTCA to the ostium of RCA using Emerge 2.0 x 12 mm balloon with reduction of stenosis from 80% to 20%.  Recommendations: 1. Admit to cardiac ICU for post STEMI monitoring. 2. Continue tirofiban  infusion for 4 hours. 3. Dual antiplatelet therapy with aspirin  and prasugrel  for at least 12 months.  Given overlapping stents in the distal RCA and very late stent thrombosis, consider long-term DAPT. 4. Medical therapy of noncritical disease involving the LAD, LCx,  and proximal RCA.  Functional study to assess hemodynamic significance of LAD disease should be considered when patient has recovered from acute MI. 5. Aggressive secondary prevention.  Sammy Crisp, MD [facility]  Findings Coronary  Findings Diagnostic  Dominance: Right  Left Main Vessel is large. Vessel is angiographically normal.  Left Anterior Descending Vessel is large. Mid LAD-1 lesion is 50% stenosed. Mid LAD-2 lesion is 60% stenosed.  First Diagonal Branch Vessel is large in size.  Second Diagonal Branch Vessel is moderate in size.  Ramus Intermedius Vessel is small.  Left Circumflex Vessel is moderate in size. Prox Cx to Mid Cx lesion is 40% stenosed.  First Obtuse Marginal Branch Vessel is moderate in size.  Right Coronary Artery Vessel is moderate in size. Prox RCA lesion is 50% stenosed. The lesion is eccentric. Dist RCA-1 lesion is 100% stenosed. The lesion was previously treated using a drug eluting stent over 2 years ago. Dist RCA-2 lesion is 90% stenosed.  Right Posterior Descending Artery Vessel is moderate in size. RPDA lesion is 80% stenosed.  Right Posterior Atrioventricular Artery Vessel is moderate in size.  Intervention  Dist RCA-1 lesion Stent (Also treats lesions: Dist RCA-2) A drug-eluting stent was successfully placed using a SYNERGY XD 2.50X24. Post-Intervention Lesion Assessment The intervention was successful. Pre-interventional TIMI flow is 0. Post-intervention TIMI flow is 3. No complications occurred at this lesion. There is a 0% residual stenosis post intervention.  Dist RCA-2 lesion Stent (Also treats lesions: Dist RCA-1) See details in Dist RCA-1 lesion. Post-Intervention Lesion Assessment The intervention was successful. Pre-interventional TIMI flow is 0. Post-intervention TIMI flow is 3. No complications occurred at this lesion. There is a 0% residual stenosis post intervention.  RPDA lesion Angioplasty Balloon angioplasty was performed using a BALLOON EMERGE MR 2.0X12. Post-Intervention Lesion Assessment The intervention was successful. Pre-interventional TIMI flow is 0. Post-intervention TIMI flow is 3. No complications occurred at this  lesion. There is a 20% residual stenosis post intervention.     ECHOCARDIOGRAM  ECHOCARDIOGRAM COMPLETE 11/16/2019  Narrative ECHOCARDIOGRAM REPORT    Patient Name:   Sarah Ali Date of Exam: 11/16/2019 Medical Rec #:  [id number]    Height:       66.0 in Accession #:    [id number]   Weight:       180.0 lb Date of Birth:  02-27-1956     BSA:          1.912 m Patient Age:    63 years     BP:           95/59 mmHg Patient Gender: F            HR:           64 bpm. Exam Location:  Inpatient  Procedure: 2D Echo, Color Doppler and Cardiac Doppler  Indications:    STEMI  History:        Patient has prior history of Echocardiogram examinations, most recent 11/26/2013. STEMI; Risk Factors:Hypertension, Diabetes and Dyslipidemia. Prior performed at [facility].  Sonographer:    Sherline Distel Senior RDCS Referring Phys: [phone] [facility]  IMPRESSIONS   1. Normal LV function; grade 1 diastolic dysfunction; mild LVH; small pericardial effusion. 2. Left ventricular ejection fraction, by estimation, is 55 to 60%. The left ventricle has normal function. The left ventricle has no regional wall motion abnormalities. There is mild left ventricular hypertrophy. Left ventricular diastolic parameters are consistent with Grade I diastolic dysfunction (impaired relaxation). 3. Right ventricular systolic function is  normal. The right ventricular size is normal. 4. The mitral valve is normal in structure. No evidence of mitral valve regurgitation. No evidence of mitral stenosis. 5. The aortic valve is tricuspid. Aortic valve regurgitation is not visualized. No aortic stenosis is present. 6. The inferior vena cava is normal in size with greater than 50% respiratory variability, suggesting right atrial pressure of 3 mmHg.  FINDINGS Left Ventricle: Left ventricular ejection fraction, by estimation, is 55 to 60%. The left ventricle has normal function. The left ventricle has no regional wall motion abnormalities. The  left ventricular internal cavity size was normal in size. There is mild left ventricular hypertrophy. Left ventricular diastolic parameters are consistent with Grade I diastolic dysfunction (impaired relaxation).  Right Ventricle: The right ventricular size is normal.Right ventricular systolic function is normal.  Left Atrium: Left atrial size was normal in size.  Right Atrium: Right atrial size was normal in size.  Pericardium: A small pericardial effusion is present.  Mitral Valve: The mitral valve is normal in structure. Normal mobility of the mitral valve leaflets. No evidence of mitral valve regurgitation. No evidence of mitral valve stenosis.  Tricuspid Valve: The tricuspid valve is normal in structure. Tricuspid valve regurgitation is trivial. No evidence of tricuspid stenosis.  Aortic Valve: The aortic valve is tricuspid. Aortic valve regurgitation is not visualized. No aortic stenosis is present.  Pulmonic Valve: The pulmonic valve was normal in structure. Pulmonic valve regurgitation is not visualized. No evidence of pulmonic stenosis.  Aorta: The aortic root is normal in size and structure.  Venous: The inferior vena cava is normal in size with greater than 50% respiratory variability, suggesting right atrial pressure of 3 mmHg.  IAS/Shunts: No atrial level shunt detected by color flow Doppler.  Additional Comments: Normal LV function; grade 1 diastolic dysfunction; mild LVH; small pericardial effusion.   LEFT VENTRICLE PLAX 2D LVIDd:         4.23 cm  Diastology LVIDs:         2.58 cm  LV e' lateral:   7.29 cm/s LV PW:         1.24 cm  LV E/e' lateral: 7.7 LV IVS:        1.14 cm  LV e' medial:    4.79 cm/s LVOT diam:     1.80 cm  LV E/e' medial:  11.8 LV SV:         50 LV SV Index:   26 LVOT Area:     2.54 cm   RIGHT VENTRICLE RV S prime:     18.30 cm/s TAPSE (M-mode): 2.3 cm  LEFT ATRIUM             Index       RIGHT ATRIUM           Index LA diam:         3.20 cm 1.67 cm/m  RA Area:     12.20 cm LA Vol (A2C):   36.4 ml 19.04 ml/m RA Volume:   27.90 ml  14.59 ml/m LA Vol (A4C):   42.6 ml 22.28 ml/m LA Biplane Vol: 40.4 ml 21.13 ml/m AORTIC VALVE LVOT Vmax:   85.70 cm/s LVOT Vmean:  57.400 cm/s LVOT VTI:    0.195 m  AORTA Ao Root diam: 2.80 cm Ao Asc diam:  2.80 cm  MITRAL VALVE MV Area (PHT): 3.60 cm    SHUNTS MV Decel Time: 211 msec    Systemic VTI:  0.20 m MV E velocity: 56.30  cm/s  Systemic Diam: 1.80 cm MV A velocity: 72.30 cm/s MV E/A ratio:  0.78  Alexandria Angel MD Electronically signed by Alexandria Angel MD Signature Date/Time: 11/16/2019/4:25:04 PM    Final          ______________________________________________________________________________________________     Recent Labs: No results found for requested labs within last 365 days.  Recent Lipid Panel    Component Value Date/Time   CHOL 152 01/08/2020 0826   TRIG 139 01/08/2020 0826   HDL 42 01/08/2020 0826   CHOLHDL 3.6 01/08/2020 0826   CHOLHDL 4.2 11/16/2019 1102   VLDL 57 (H) 11/16/2019 1102   LDLCALC 85 01/08/2020 0826    History of Present Illness    68 year old female with the above past medical history including CAD s/p prior stenting-RCA, DES-distal RCA, PTCA-ostium of RCA in 11/2019, hypertension, hyperlipidemia, and type 2 diabetes.   She history of CAD with prior stenting to her RCA.  She was hospitalized in July 2021 in the setting STEMI. Cardiac catheterization revealed severe single-vessel coronary artery disease with thrombotic occlusion of the distal RCA s/p DES-distal RCA extending from the distal half of prior stent, s/p PTCA-ostium of RCA with reduction of stenosis from 80% to 20%.  There is moderate, noncritical disease involving the mid LAD, mid LCx, and proximal RCA up to 50 to 60%, low normal LV function, LVEF 50 to 55%.  Echocardiogram in 11/2019 showed EF 55 to 60%, normal LV function, no RWMA, mild LVH, G1 DD, normal RV  systolic function, no significant valvular abnormalities.  She was last seen in the office on 07/25/2023 and was stable from a cardiac standpoint.  BP was elevated.  Amlodipine  was increased to 7.5 mg daily.  She presents today for follow-up.  Since her last visit she has been stable overall from a cardiac standpoint.  BP has been well-controlled.  She denies any symptoms concerning for angina.  Overall, she reports feeling well.  Follow-up per recall in 03/2024 with Dr. Rolm Clos.  She has had difficulty cutting her 5 mg tablet amlodipine .  Will send amlodipine  2.5 mg tablet to take in addition to her amlodipine  5 mg tablet for a total dose of amlodipine  7.5 mg daily.  1. CAD: S/p prior stenting-RCA, DES-distal RCA, PTCA-ostium of RCA in 11/2019. Stable with no anginal symptoms. No indication for ischemic evaluation.  Continue aspirin , carvedilol , olmesartan , amlodipine , Lipitor , and Repatha.  Dr. Rolm Clos previously recommended discontinuation of prasugrel  upon completion of prescription. However, patient prefers to continue the medication.  I will reach out to Dr. Rolm Clos to make sure he is agreeable to this.    2. Hypertension: BP remains elevated above goal. Will increase amlodipine  to 7.5 mg daily.  Continue to monitor BP and report BP consistently greater than 130/80. If BP remains elevated above goal, consider addition of low-dose chlorthalidone.  Continue carvedilol , olmesartan .     3. Hyperlipidemia: LDL was 11 in 05/2023. Continue Repatha, Lipitor .    4. Type 2 diabetes: A1c was 6.7 in 05/2023. Monitored and managed per PCP.    5. Disposition: Follow-up in   Home Medications    Current Outpatient Medications  Medication Sig Dispense Refill   amLODipine  (NORVASC ) 5 MG tablet Take 1.5 tablets (7.5 mg total) by mouth daily. 135 tablet 3   aspirin  EC 81 MG tablet Take 1 tablet (81 mg total) by mouth daily. Swallow whole. 90 tablet 3   atorvastatin  (LIPITOR ) 40 MG tablet Take 40 mg by mouth daily.  carvedilol  (COREG ) 25 MG tablet Take 1 tablet (25 mg total) by mouth 2 (two) times daily with a meal. 180 tablet 3   Cholecalciferol (VITAMIN D) 2000 units tablet Take 2,000 Units by mouth daily.     Dulaglutide (TRULICITY) 1.5 MG/0.5ML SOAJ Inject 1.5 mg into the skin once a week.     Evolocumab (REPATHA SURECLICK) 140 MG/ML SOAJ 140 mg     fexofenadine (ALLEGRA ALLERGY) 180 MG tablet Take 180 mg by mouth daily.     JANUVIA 100 MG tablet Take 100 mg by mouth daily.     metFORMIN (GLUCOPHAGE-XR) 500 MG 24 hr tablet Take 1,000 mg by mouth 2 (two) times daily.     Multiple Vitamins-Minerals (MULTIVITAMIN WITH MINERALS) tablet Take 1 tablet by mouth daily.     nitroGLYCERIN  (NITROSTAT ) 0.4 MG SL tablet Place 1 tablet (0.4 mg total) under the tongue every 5 (five) minutes as needed for chest pain. 25 tablet 3   olmesartan  (BENICAR ) 40 MG tablet Take 40 mg by mouth daily.     prasugrel  (EFFIENT ) 10 MG TABS tablet Take 1 tablet by mouth daily.     vitamin E 400 UNIT capsule Take 400 Units by mouth daily.     No current facility-administered medications for this visit.     Review of Systems    ***.  All other systems reviewed and are otherwise negative except as noted above.    Physical Exam    VS:  BP 128/62   Pulse 72   Ht 5\' 6"  (1.676 m)   Wt 180 lb (81.6 kg)   SpO2 98%   BMI 29.05 kg/m  , BMI Body mass index is 29.05 kg/m.     GEN: Well nourished, well developed, in no acute distress. HEENT: normal. Neck: Supple, no JVD, carotid bruits, or masses. Cardiac: RRR, no murmurs, rubs, or gallops. No clubbing, cyanosis, edema.  Radials/DP/PT 2+ and equal bilaterally.  Respiratory:  Respirations regular and unlabored, clear to auscultation bilaterally. GI: Soft, nontender, nondistended, BS + x 4. MS: no deformity or atrophy. Skin: warm and dry, no rash. Neuro:  Strength and sensation are intact. Psych: Normal affect.  Accessory Clinical Findings    ECG personally reviewed by me  today -    - no acute changes.   Lab Results  Component Value Date   WBC 5.0 11/18/2019   HGB 12.0 11/18/2019   HCT 35.0 (L) 11/18/2019   MCV 89.5 11/18/2019   PLT 146 (L) 11/18/2019   Lab Results  Component Value Date   CREATININE 0.93 11/18/2019   BUN 19 11/18/2019   NA 139 11/18/2019   K 4.1 11/18/2019   CL 105 11/18/2019   CO2 24 11/18/2019   Lab Results  Component Value Date   ALT 51 (H) 01/08/2020   AST 25 01/08/2020   ALKPHOS 126 (H) 01/08/2020   BILITOT 0.5 01/08/2020   Lab Results  Component Value Date   CHOL 152 01/08/2020   HDL 42 01/08/2020   LDLCALC 85 01/08/2020   TRIG 139 01/08/2020   CHOLHDL 3.6 01/08/2020    Lab Results  Component Value Date   HGBA1C 6.8 (H) 11/16/2019    Assessment & Plan    1.  ***      Jude Norton, NP 10/10/2023, 9:07 AM

## 2023-10-11 ENCOUNTER — Encounter: Payer: Self-pay | Admitting: Nurse Practitioner

## 2023-12-09 ENCOUNTER — Encounter: Payer: Self-pay | Admitting: Cardiovascular Disease

## 2024-03-23 ENCOUNTER — Other Ambulatory Visit: Payer: Self-pay | Admitting: Nurse Practitioner

## 2024-04-08 NOTE — Progress Notes (Unsigned)
 Cardiology Office Note:  .   Date:  04/09/2024  ID:  Sarah Ali, DOB 01-Apr-1956, MRN [id number] PCP: Verdia Lombard, MD  [facility] Providers Cardiologist:  Darryle ONEIDA Decent, MD   History of Present Illness: .    Chief Complaint  Patient presents with   Follow-up    Sarah Ali is a 68 y.o. female with history of CAD, HTN, HLD, DM who presents for follow-up.    History of Present Illness   Sarah Ali is a 68 year old female with a history of STEMI, diabetes, hypertension, and hyperlipidemia who presents for follow-up.  Her cholesterol levels are well controlled, and her A1c has recently decreased from 6.7 to 6.6. She reports that she sometimes feels her blood pressure is affected by the stress of driving and walking.  She denies chest discomfort but mentions feeling 'something was a little off' a couple of weeks ago, which was brief and not painful. She has no trouble breathing and no issues with her current level of activity, which includes walking and climbing stairs, although she does not engage in scheduled exercise.  She is BRCA positive and has undergone an oophorectomy. She is currently undergoing an MRI every six months and had a mammogram two months ago. Her sister, who did not undergo genetic testing, was found to have cancer, which was treated with a hysterectomy.  Her current medications include Trulicity, aspirin , Effient , amlodipine  5 mg daily, carvedilol  25 mg BID, olmesartan  40 mg daily, Lipitor  20 mg daily, and Repatha.  Her older son was present during her heart attack in 2021, which has made him skittish about her health. Her father had heart disease, and her father's sister and father were diabetic, as is her brother.          Problem List 1. STEMI -11/2019 -DES to d RCA (occluded old stent) -60% mid LAD -40% LCX -post infarct pericarditis 2. DM -A1c 6.6 3. HLD -T chol 93, HDL 48, LDL 15, TG 196 4. HTN    ROS: All other ROS reviewed  and negative. Pertinent positives noted in the HPI.     Studies Reviewed: SABRA   EKG Interpretation Date/Time:  Thursday April 09 2024 13:04:41 EST Ventricular Rate:  68 PR Interval:  136 QRS Duration:  74 QT Interval:  400 QTC Calculation: 425 R Axis:   87  Text Interpretation: Sinus rhythm with occasional Premature ventricular complexes Confirmed by Decent Darryle [phone]) on 04/09/2024 1:09:04 PM   Physical Exam:   VS:  BP (!) 165/89   Pulse 68   Ht 5' 6 (1.676 m)   Wt 179 lb (81.2 kg)   SpO2 99%   BMI 28.89 kg/m    Wt Readings from Last 3 Encounters:  04/09/24 179 lb (81.2 kg)  10/10/23 180 lb (81.6 kg)  07/25/23 179 lb (81.2 kg)    GEN: Well nourished, well developed in no acute distress NECK: No JVD; No carotid bruits CARDIAC: RRR, no murmurs, rubs, gallops RESPIRATORY:  Clear to auscultation without rales, wheezing or rhonchi  ABDOMEN: Soft, non-tender, non-distended EXTREMITIES:  No edema; No deformity  ASSESSMENT AND PLAN: .   Assessment and Plan    Coronary artery disease with prior STEMI and stent placement Coronary artery disease with inferior STEMI in 2021 and prior drug-eluting stent placement in the distal RCA. No current chest discomfort or dyspnea. Blood pressure slightly elevated. Cholesterol levels well-controlled. Discussed LPA genetic testing and continuation of aspirin  and Effient . - Ordered LPA genetic  test. - Continue aspirin  and Effient . She wishes to remain on this indefinitely.  - Monitor for symptoms such as chest discomfort or dyspnea. - Consider stress test or catheterization if symptoms develop.  Essential hypertension Blood pressure slightly elevated. Previous control noted. - Monitor blood pressure at home once daily. - Continue current antihypertensive regimen: amlodipine  5 mg daily, carvedilol  25 mg BID, olmesartan  40 mg daily.  Type 2 diabetes mellitus Well-controlled with A1c at 6.6. No endocrinologist involved. Current management  includes Trulicity. - Continue Trulicity. - Monitor A1c levels.  Hyperlipidemia Well-controlled with LDL at 15. Current management includes Repatha and Lipitor . - Continue Repatha and Lipitor . - Checked LPA genetic test.              Follow-up: Return in about 1 year (around 04/09/2025).  Signed, Darryle DASEN. Barbaraann, MD, [facility]  [facility]  [address] [place], [place] [address] [phone]  1:53 PM

## 2024-04-09 ENCOUNTER — Encounter: Payer: Self-pay | Admitting: Cardiovascular Disease

## 2024-04-09 ENCOUNTER — Ambulatory Visit: Attending: Cardiovascular Disease | Admitting: Cardiovascular Disease

## 2024-04-09 VITALS — BP 165/89 | HR 68 | Ht 66.0 in | Wt 179.0 lb

## 2024-04-09 DIAGNOSIS — I10 Essential (primary) hypertension: Secondary | ICD-10-CM | POA: Diagnosis not present

## 2024-04-09 DIAGNOSIS — I2510 Atherosclerotic heart disease of native coronary artery without angina pectoris: Secondary | ICD-10-CM | POA: Diagnosis not present

## 2024-04-09 DIAGNOSIS — E785 Hyperlipidemia, unspecified: Secondary | ICD-10-CM | POA: Diagnosis present

## 2024-04-09 NOTE — Patient Instructions (Signed)
 Medication Instructions:  Your physician recommends that you continue on your current medications as directed. Please refer to the Current Medication list given to you today.  *If you need a refill on your cardiac medications before your next appointment, please call your pharmacy*  Lab Work: Today- LP(a)  If you have labs (blood work) drawn today and your tests are completely normal, you will receive your results only by: MyChart Message (if you have MyChart) OR A paper copy in the mail If you have any lab test that is abnormal or we need to change your treatment, we will call you to review the results.    Follow-Up: At [facility], you and your health needs are our priority.  As part of our continuing mission to provide you with exceptional heart care, our providers are all part of one team.  This team includes your primary Cardiologist (physician) and Advanced Practice Providers or APPs (Physician Assistants and Nurse Practitioners) who all work together to provide you with the care you need, when you need it.  Your next appointment:   12 month(s)  Provider:   One of our Advanced Practice Providers (APPs): Morse Clause, PA-C  Lamarr Satterfield, NP Miriam Shams, NP  Olivia Pavy, PA-C Josefa Beauvais, NP  Leontine Salen, PA-C Orren Fabry, PA-C  Pollock, PA-C Ernest Dick, NP  Damien Braver, NP Jon Hails, PA-C  Waddell Donath, PA-C    Dayna Dunn, PA-C  Scott Weaver, PA-C Lum Louis, NP Katlyn West, NP Callie Goodrich, PA-C  Xika Zhao, NP Sheng Haley, PA-C    Kathleen Johnson, PA-C

## 2024-04-10 LAB — LIPOPROTEIN A (LPA): Lipoprotein (a): 174.2 nmol/L — ABNORMAL HIGH (ref <75.0)

## 2024-04-11 ENCOUNTER — Ambulatory Visit: Payer: Self-pay | Admitting: Cardiovascular Disease

## 2024-05-23 ENCOUNTER — Other Ambulatory Visit: Payer: Self-pay | Admitting: Nurse Practitioner
